# Patient Record
Sex: Male | Born: 1935
Health system: Southern US, Community
[De-identification: ages and names within clinical notes are randomized; demographics above are authoritative.]

## PROBLEM LIST (undated history)

## (undated) DIAGNOSIS — E7800 Pure hypercholesterolemia, unspecified: Secondary | ICD-10-CM

## (undated) DIAGNOSIS — H9190 Unspecified hearing loss, unspecified ear: Secondary | ICD-10-CM

## (undated) DIAGNOSIS — I10 Essential (primary) hypertension: Secondary | ICD-10-CM

## (undated) DIAGNOSIS — G629 Polyneuropathy, unspecified: Secondary | ICD-10-CM

## (undated) DIAGNOSIS — C61 Malignant neoplasm of prostate: Secondary | ICD-10-CM

## (undated) DIAGNOSIS — M1990 Unspecified osteoarthritis, unspecified site: Secondary | ICD-10-CM

## (undated) DIAGNOSIS — G4761 Periodic limb movement disorder: Secondary | ICD-10-CM

## (undated) HISTORY — DX: Essential (primary) hypertension: I10

## (undated) HISTORY — DX: Polyneuropathy, unspecified: G62.9

## (undated) HISTORY — DX: Periodic limb movement disorder: G47.61

## (undated) HISTORY — DX: Unspecified hearing loss, unspecified ear: H91.90

## (undated) HISTORY — DX: Pure hypercholesterolemia, unspecified: E78.00

## (undated) HISTORY — PX: PENILE PROSTHESIS IMPLANT: SHX240

## (undated) HISTORY — PX: APPENDECTOMY: SHX54

## (undated) HISTORY — DX: Unspecified osteoarthritis, unspecified site: M19.90

## (undated) HISTORY — PX: PROSTATECTOMY: SHX69

## (undated) HISTORY — DX: Malignant neoplasm of prostate: C61

## (undated) HISTORY — PX: TONSILLECTOMY AND ADENOIDECTOMY: SUR1326

---

## 2004-07-24 ENCOUNTER — Encounter: Admission: RE | Admit: 2004-07-24 | Discharge: 2004-07-24 | Payer: Self-pay | Admitting: General Surgery

## 2004-07-30 ENCOUNTER — Ambulatory Visit (HOSPITAL_COMMUNITY): Admission: RE | Admit: 2004-07-30 | Discharge: 2004-07-30 | Payer: Self-pay | Admitting: General Surgery

## 2004-07-30 ENCOUNTER — Ambulatory Visit (HOSPITAL_BASED_OUTPATIENT_CLINIC_OR_DEPARTMENT_OTHER): Admission: RE | Admit: 2004-07-30 | Discharge: 2004-07-30 | Payer: Self-pay | Admitting: General Surgery

## 2014-03-04 DIAGNOSIS — E780 Pure hypercholesterolemia: Secondary | ICD-10-CM | POA: Diagnosis not present

## 2014-03-04 DIAGNOSIS — R001 Bradycardia, unspecified: Secondary | ICD-10-CM | POA: Diagnosis not present

## 2014-03-04 DIAGNOSIS — Z7982 Long term (current) use of aspirin: Secondary | ICD-10-CM | POA: Diagnosis not present

## 2014-03-04 DIAGNOSIS — M1990 Unspecified osteoarthritis, unspecified site: Secondary | ICD-10-CM | POA: Diagnosis not present

## 2014-03-04 DIAGNOSIS — R55 Syncope and collapse: Secondary | ICD-10-CM | POA: Diagnosis not present

## 2014-03-04 DIAGNOSIS — I10 Essential (primary) hypertension: Secondary | ICD-10-CM | POA: Diagnosis not present

## 2014-03-04 DIAGNOSIS — Z87891 Personal history of nicotine dependence: Secondary | ICD-10-CM | POA: Diagnosis not present

## 2014-03-06 DIAGNOSIS — E780 Pure hypercholesterolemia: Secondary | ICD-10-CM | POA: Diagnosis not present

## 2014-03-06 DIAGNOSIS — G629 Polyneuropathy, unspecified: Secondary | ICD-10-CM | POA: Diagnosis not present

## 2014-03-06 DIAGNOSIS — I10 Essential (primary) hypertension: Secondary | ICD-10-CM | POA: Diagnosis not present

## 2014-03-06 DIAGNOSIS — R4181 Age-related cognitive decline: Secondary | ICD-10-CM | POA: Diagnosis not present

## 2014-03-06 DIAGNOSIS — G4709 Other insomnia: Secondary | ICD-10-CM | POA: Diagnosis not present

## 2014-06-26 DIAGNOSIS — M170 Bilateral primary osteoarthritis of knee: Secondary | ICD-10-CM | POA: Diagnosis not present

## 2014-08-15 ENCOUNTER — Telehealth: Payer: Self-pay | Admitting: Critical Care Medicine

## 2014-09-04 NOTE — Telephone Encounter (Signed)
error 

## 2014-09-18 DIAGNOSIS — H3531 Nonexudative age-related macular degeneration: Secondary | ICD-10-CM | POA: Diagnosis not present

## 2014-09-18 DIAGNOSIS — H5201 Hypermetropia, right eye: Secondary | ICD-10-CM | POA: Diagnosis not present

## 2014-09-28 DIAGNOSIS — M170 Bilateral primary osteoarthritis of knee: Secondary | ICD-10-CM | POA: Diagnosis not present

## 2014-10-19 DIAGNOSIS — L570 Actinic keratosis: Secondary | ICD-10-CM | POA: Diagnosis not present

## 2014-11-04 DIAGNOSIS — N3000 Acute cystitis without hematuria: Secondary | ICD-10-CM | POA: Diagnosis not present

## 2014-11-04 DIAGNOSIS — N451 Epididymitis: Secondary | ICD-10-CM | POA: Diagnosis not present

## 2014-11-20 DIAGNOSIS — Z23 Encounter for immunization: Secondary | ICD-10-CM | POA: Diagnosis not present

## 2014-12-29 DIAGNOSIS — M1712 Unilateral primary osteoarthritis, left knee: Secondary | ICD-10-CM | POA: Diagnosis not present

## 2014-12-29 DIAGNOSIS — M1711 Unilateral primary osteoarthritis, right knee: Secondary | ICD-10-CM | POA: Diagnosis not present

## 2015-01-09 DIAGNOSIS — Z7689 Persons encountering health services in other specified circumstances: Secondary | ICD-10-CM | POA: Diagnosis not present

## 2015-01-09 DIAGNOSIS — H6123 Impacted cerumen, bilateral: Secondary | ICD-10-CM | POA: Diagnosis not present

## 2015-01-09 DIAGNOSIS — H903 Sensorineural hearing loss, bilateral: Secondary | ICD-10-CM | POA: Diagnosis not present

## 2015-01-11 ENCOUNTER — Other Ambulatory Visit: Payer: Self-pay | Admitting: Otolaryngology

## 2015-01-11 DIAGNOSIS — H903 Sensorineural hearing loss, bilateral: Secondary | ICD-10-CM

## 2015-01-15 DIAGNOSIS — M1711 Unilateral primary osteoarthritis, right knee: Secondary | ICD-10-CM | POA: Diagnosis not present

## 2015-01-16 DIAGNOSIS — Z0000 Encounter for general adult medical examination without abnormal findings: Secondary | ICD-10-CM | POA: Diagnosis not present

## 2015-01-16 DIAGNOSIS — E7800 Pure hypercholesterolemia, unspecified: Secondary | ICD-10-CM | POA: Diagnosis not present

## 2015-01-16 DIAGNOSIS — Z79899 Other long term (current) drug therapy: Secondary | ICD-10-CM | POA: Diagnosis not present

## 2015-01-16 DIAGNOSIS — I10 Essential (primary) hypertension: Secondary | ICD-10-CM | POA: Diagnosis not present

## 2015-01-16 DIAGNOSIS — G4709 Other insomnia: Secondary | ICD-10-CM | POA: Diagnosis not present

## 2015-01-16 DIAGNOSIS — G629 Polyneuropathy, unspecified: Secondary | ICD-10-CM | POA: Diagnosis not present

## 2015-01-22 DIAGNOSIS — M1712 Unilateral primary osteoarthritis, left knee: Secondary | ICD-10-CM | POA: Diagnosis not present

## 2015-01-29 ENCOUNTER — Ambulatory Visit
Admission: RE | Admit: 2015-01-29 | Discharge: 2015-01-29 | Disposition: A | Discharge status: Home / Self Care | Payer: Medicare Other | Source: Ambulatory Visit | Attending: Otolaryngology | Admitting: Otolaryngology

## 2015-01-29 DIAGNOSIS — H903 Sensorineural hearing loss, bilateral: Secondary | ICD-10-CM

## 2015-01-29 DIAGNOSIS — H9193 Unspecified hearing loss, bilateral: Secondary | ICD-10-CM | POA: Diagnosis not present

## 2015-01-29 MED ORDER — GADOBENATE DIMEGLUMINE 529 MG/ML IV SOLN
19.0000 mL | Freq: Once | INTRAVENOUS | Status: AC | PRN
  Administered 2015-01-29: 19 mL via INTRAVENOUS

## 2015-03-05 DIAGNOSIS — H903 Sensorineural hearing loss, bilateral: Secondary | ICD-10-CM | POA: Diagnosis not present

## 2015-03-21 DIAGNOSIS — H353132 Nonexudative age-related macular degeneration, bilateral, intermediate dry stage: Secondary | ICD-10-CM | POA: Diagnosis not present

## 2015-04-09 DIAGNOSIS — H9190 Unspecified hearing loss, unspecified ear: Secondary | ICD-10-CM | POA: Diagnosis not present

## 2015-04-12 DIAGNOSIS — H353232 Exudative age-related macular degeneration, bilateral, with inactive choroidal neovascularization: Secondary | ICD-10-CM | POA: Diagnosis not present

## 2015-05-25 DIAGNOSIS — M1711 Unilateral primary osteoarthritis, right knee: Secondary | ICD-10-CM | POA: Diagnosis not present

## 2015-05-25 DIAGNOSIS — M1712 Unilateral primary osteoarthritis, left knee: Secondary | ICD-10-CM | POA: Diagnosis not present

## 2015-05-25 DIAGNOSIS — M170 Bilateral primary osteoarthritis of knee: Secondary | ICD-10-CM | POA: Diagnosis not present

## 2015-07-24 DIAGNOSIS — M170 Bilateral primary osteoarthritis of knee: Secondary | ICD-10-CM | POA: Diagnosis not present

## 2015-07-31 DIAGNOSIS — M1712 Unilateral primary osteoarthritis, left knee: Secondary | ICD-10-CM | POA: Diagnosis not present

## 2015-08-01 DIAGNOSIS — M1711 Unilateral primary osteoarthritis, right knee: Secondary | ICD-10-CM | POA: Diagnosis not present

## 2015-08-21 DIAGNOSIS — M25572 Pain in left ankle and joints of left foot: Secondary | ICD-10-CM | POA: Diagnosis not present

## 2015-08-30 DIAGNOSIS — M25672 Stiffness of left ankle, not elsewhere classified: Secondary | ICD-10-CM | POA: Diagnosis not present

## 2015-08-30 DIAGNOSIS — M25572 Pain in left ankle and joints of left foot: Secondary | ICD-10-CM | POA: Diagnosis not present

## 2015-08-30 DIAGNOSIS — R2689 Other abnormalities of gait and mobility: Secondary | ICD-10-CM | POA: Diagnosis not present

## 2015-09-03 DIAGNOSIS — M25672 Stiffness of left ankle, not elsewhere classified: Secondary | ICD-10-CM | POA: Diagnosis not present

## 2015-09-03 DIAGNOSIS — M25572 Pain in left ankle and joints of left foot: Secondary | ICD-10-CM | POA: Diagnosis not present

## 2015-09-03 DIAGNOSIS — R2689 Other abnormalities of gait and mobility: Secondary | ICD-10-CM | POA: Diagnosis not present

## 2015-09-06 DIAGNOSIS — R2689 Other abnormalities of gait and mobility: Secondary | ICD-10-CM | POA: Diagnosis not present

## 2015-09-06 DIAGNOSIS — M25672 Stiffness of left ankle, not elsewhere classified: Secondary | ICD-10-CM | POA: Diagnosis not present

## 2015-09-06 DIAGNOSIS — M25572 Pain in left ankle and joints of left foot: Secondary | ICD-10-CM | POA: Diagnosis not present

## 2015-09-10 DIAGNOSIS — R2689 Other abnormalities of gait and mobility: Secondary | ICD-10-CM | POA: Diagnosis not present

## 2015-09-10 DIAGNOSIS — M25672 Stiffness of left ankle, not elsewhere classified: Secondary | ICD-10-CM | POA: Diagnosis not present

## 2015-09-10 DIAGNOSIS — M25572 Pain in left ankle and joints of left foot: Secondary | ICD-10-CM | POA: Diagnosis not present

## 2015-09-12 DIAGNOSIS — M1712 Unilateral primary osteoarthritis, left knee: Secondary | ICD-10-CM | POA: Diagnosis not present

## 2015-09-12 DIAGNOSIS — M1711 Unilateral primary osteoarthritis, right knee: Secondary | ICD-10-CM | POA: Diagnosis not present

## 2015-09-13 DIAGNOSIS — R2689 Other abnormalities of gait and mobility: Secondary | ICD-10-CM | POA: Diagnosis not present

## 2015-09-13 DIAGNOSIS — M25572 Pain in left ankle and joints of left foot: Secondary | ICD-10-CM | POA: Diagnosis not present

## 2015-09-13 DIAGNOSIS — M25672 Stiffness of left ankle, not elsewhere classified: Secondary | ICD-10-CM | POA: Diagnosis not present

## 2015-09-17 DIAGNOSIS — Z23 Encounter for immunization: Secondary | ICD-10-CM | POA: Diagnosis not present

## 2015-09-17 DIAGNOSIS — S41101S Unspecified open wound of right upper arm, sequela: Secondary | ICD-10-CM | POA: Diagnosis not present

## 2015-09-17 DIAGNOSIS — M7989 Other specified soft tissue disorders: Secondary | ICD-10-CM | POA: Diagnosis not present

## 2015-09-19 DIAGNOSIS — M25572 Pain in left ankle and joints of left foot: Secondary | ICD-10-CM | POA: Diagnosis not present

## 2015-09-19 DIAGNOSIS — R2689 Other abnormalities of gait and mobility: Secondary | ICD-10-CM | POA: Diagnosis not present

## 2015-09-19 DIAGNOSIS — M25672 Stiffness of left ankle, not elsewhere classified: Secondary | ICD-10-CM | POA: Diagnosis not present

## 2015-09-21 DIAGNOSIS — M25672 Stiffness of left ankle, not elsewhere classified: Secondary | ICD-10-CM | POA: Diagnosis not present

## 2015-09-21 DIAGNOSIS — M25572 Pain in left ankle and joints of left foot: Secondary | ICD-10-CM | POA: Diagnosis not present

## 2015-09-21 DIAGNOSIS — R2689 Other abnormalities of gait and mobility: Secondary | ICD-10-CM | POA: Diagnosis not present

## 2015-09-25 DIAGNOSIS — M1711 Unilateral primary osteoarthritis, right knee: Secondary | ICD-10-CM | POA: Diagnosis not present

## 2015-09-25 DIAGNOSIS — M25672 Stiffness of left ankle, not elsewhere classified: Secondary | ICD-10-CM | POA: Diagnosis not present

## 2015-09-25 DIAGNOSIS — R2689 Other abnormalities of gait and mobility: Secondary | ICD-10-CM | POA: Diagnosis not present

## 2015-09-25 DIAGNOSIS — M1712 Unilateral primary osteoarthritis, left knee: Secondary | ICD-10-CM | POA: Diagnosis not present

## 2015-09-25 DIAGNOSIS — M25572 Pain in left ankle and joints of left foot: Secondary | ICD-10-CM | POA: Diagnosis not present

## 2015-09-27 DIAGNOSIS — M25572 Pain in left ankle and joints of left foot: Secondary | ICD-10-CM | POA: Diagnosis not present

## 2015-09-27 DIAGNOSIS — M25672 Stiffness of left ankle, not elsewhere classified: Secondary | ICD-10-CM | POA: Diagnosis not present

## 2015-09-27 DIAGNOSIS — R2689 Other abnormalities of gait and mobility: Secondary | ICD-10-CM | POA: Diagnosis not present

## 2015-10-04 DIAGNOSIS — M25572 Pain in left ankle and joints of left foot: Secondary | ICD-10-CM | POA: Diagnosis not present

## 2015-10-04 DIAGNOSIS — R2689 Other abnormalities of gait and mobility: Secondary | ICD-10-CM | POA: Diagnosis not present

## 2015-10-04 DIAGNOSIS — M25672 Stiffness of left ankle, not elsewhere classified: Secondary | ICD-10-CM | POA: Diagnosis not present

## 2015-10-09 DIAGNOSIS — R2689 Other abnormalities of gait and mobility: Secondary | ICD-10-CM | POA: Diagnosis not present

## 2015-10-09 DIAGNOSIS — M25572 Pain in left ankle and joints of left foot: Secondary | ICD-10-CM | POA: Diagnosis not present

## 2015-10-09 DIAGNOSIS — M25672 Stiffness of left ankle, not elsewhere classified: Secondary | ICD-10-CM | POA: Diagnosis not present

## 2015-11-14 DIAGNOSIS — Z23 Encounter for immunization: Secondary | ICD-10-CM | POA: Diagnosis not present

## 2015-12-24 DIAGNOSIS — M170 Bilateral primary osteoarthritis of knee: Secondary | ICD-10-CM | POA: Diagnosis not present

## 2016-02-04 DIAGNOSIS — M1711 Unilateral primary osteoarthritis, right knee: Secondary | ICD-10-CM | POA: Diagnosis not present

## 2016-02-08 DIAGNOSIS — M1712 Unilateral primary osteoarthritis, left knee: Secondary | ICD-10-CM | POA: Diagnosis not present

## 2016-03-11 DIAGNOSIS — Z0000 Encounter for general adult medical examination without abnormal findings: Secondary | ICD-10-CM | POA: Diagnosis not present

## 2016-03-11 DIAGNOSIS — E785 Hyperlipidemia, unspecified: Secondary | ICD-10-CM | POA: Diagnosis not present

## 2016-03-11 DIAGNOSIS — I10 Essential (primary) hypertension: Secondary | ICD-10-CM | POA: Diagnosis not present

## 2016-03-11 DIAGNOSIS — Z79899 Other long term (current) drug therapy: Secondary | ICD-10-CM | POA: Diagnosis not present

## 2016-03-25 DIAGNOSIS — M25569 Pain in unspecified knee: Secondary | ICD-10-CM | POA: Diagnosis not present

## 2016-03-25 DIAGNOSIS — M1711 Unilateral primary osteoarthritis, right knee: Secondary | ICD-10-CM | POA: Diagnosis not present

## 2016-03-25 DIAGNOSIS — M1712 Unilateral primary osteoarthritis, left knee: Secondary | ICD-10-CM | POA: Diagnosis not present

## 2016-05-20 DIAGNOSIS — H353132 Nonexudative age-related macular degeneration, bilateral, intermediate dry stage: Secondary | ICD-10-CM | POA: Diagnosis not present

## 2016-05-20 DIAGNOSIS — H52223 Regular astigmatism, bilateral: Secondary | ICD-10-CM | POA: Diagnosis not present

## 2016-05-22 DIAGNOSIS — M1712 Unilateral primary osteoarthritis, left knee: Secondary | ICD-10-CM | POA: Diagnosis not present

## 2016-05-22 DIAGNOSIS — M1711 Unilateral primary osteoarthritis, right knee: Secondary | ICD-10-CM | POA: Diagnosis not present

## 2016-05-27 DIAGNOSIS — I10 Essential (primary) hypertension: Secondary | ICD-10-CM | POA: Diagnosis not present

## 2016-05-27 DIAGNOSIS — B356 Tinea cruris: Secondary | ICD-10-CM | POA: Diagnosis not present

## 2016-05-27 DIAGNOSIS — N401 Enlarged prostate with lower urinary tract symptoms: Secondary | ICD-10-CM | POA: Diagnosis not present

## 2016-05-27 DIAGNOSIS — G4761 Periodic limb movement disorder: Secondary | ICD-10-CM | POA: Diagnosis not present

## 2016-07-14 DIAGNOSIS — G2581 Restless legs syndrome: Secondary | ICD-10-CM | POA: Diagnosis not present

## 2016-07-14 DIAGNOSIS — M7989 Other specified soft tissue disorders: Secondary | ICD-10-CM | POA: Diagnosis not present

## 2016-07-29 DIAGNOSIS — M170 Bilateral primary osteoarthritis of knee: Secondary | ICD-10-CM | POA: Diagnosis not present

## 2016-07-29 DIAGNOSIS — M1711 Unilateral primary osteoarthritis, right knee: Secondary | ICD-10-CM | POA: Diagnosis not present

## 2016-07-29 DIAGNOSIS — M1712 Unilateral primary osteoarthritis, left knee: Secondary | ICD-10-CM | POA: Diagnosis not present

## 2016-08-03 IMAGING — MR MR HEAD WO/W CM
10 of 11 series · 30 of 48 positions shown · IV contrast (multihance)
Comparison: Head CT 03/04/2014

CLINICAL DATA: Bilateral hearing loss, assess for retrocochlear
lesion.

Creatinine was obtained on site at [HOSPITAL] at [HOSPITAL].
Results: Creatinine 1.0 mg/dL.
EXAM:
MRI HEAD WITHOUT AND WITH CONTRAST
TECHNIQUE: Multiplanar, multiecho pulse sequences of the brain and surrounding
structures were obtained without and with intravenous contrast.
CONTRAST:  19mL MULTIHANCE GADOBENATE DIMEGLUMINE 529 MG/ML IV SOLN

[Series 2: t1_se_sag · sagittal · 5.0mm · 0.45mm/px · 2 of 19 slices shown]
[im 1/19]
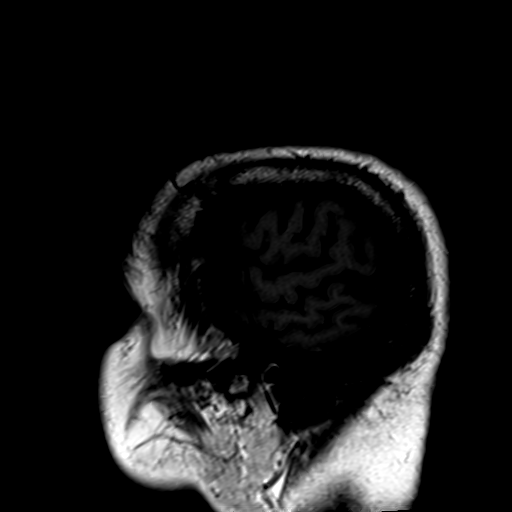
[im 19/19]
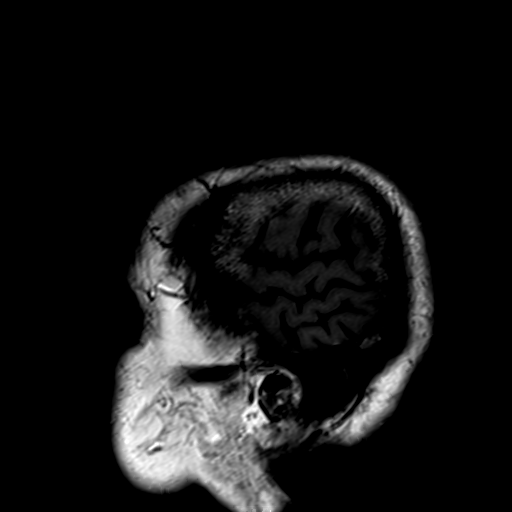

[Series 3: t2_tse_tra_512 · axial · 5.0mm · 0.30mm/px · z∈[-66,+78]mm · 2 of 22 slices shown]
[im 1/22]
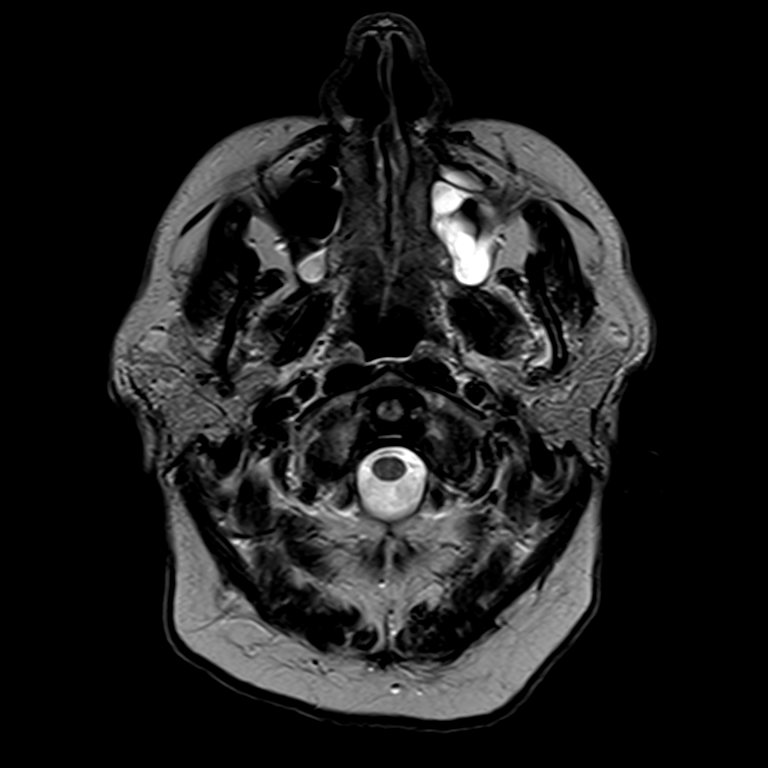
[im 22/22]
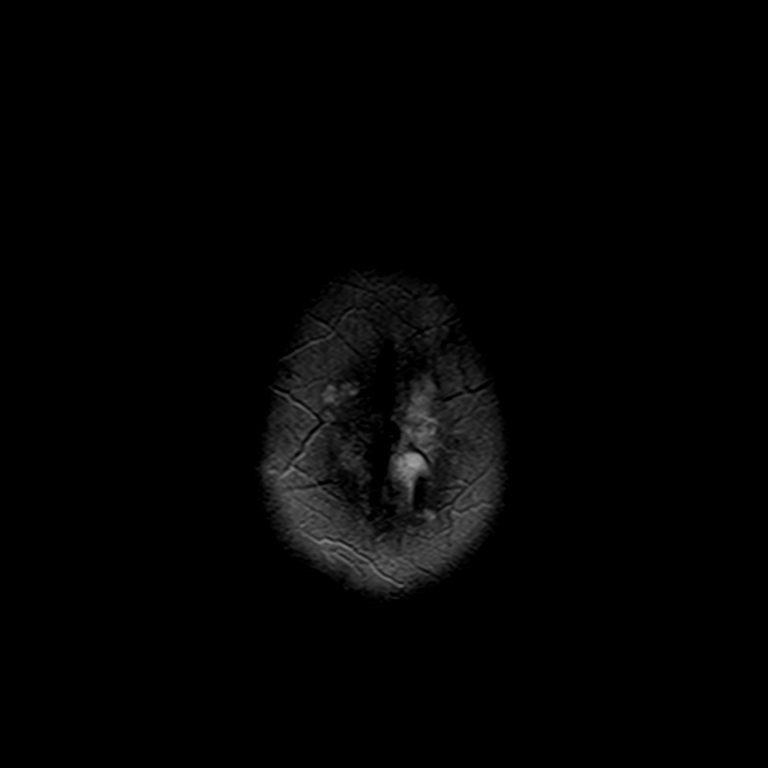

[Series 4: ep2d_diff_(id)_trace · axial · 3.0mm · 1.80mm/px · z∈[-64,+75]mm · 8 of 87 slices shown]
[im 1/87]
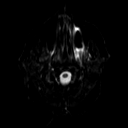
[im 18/87]
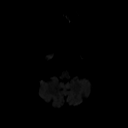
[im 26/87]
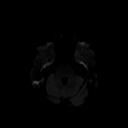
[im 35/87]
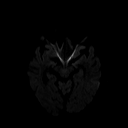
[im 52/87]
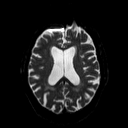
[im 61/87]
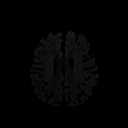
[im 69/87]
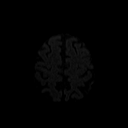
[im 87/87]
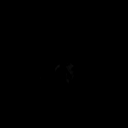

[Series 5: ep2d_diff_(id)_trace_adc · axial · 3.0mm · 1.80mm/px · z∈[-64,+75]mm · 6 of 44 slices shown]
[im 1/44]
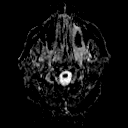
[im 9/44]
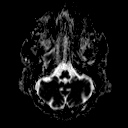
[im 18/44]
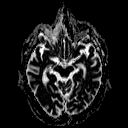
[im 26/44]
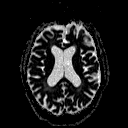
[im 35/44]
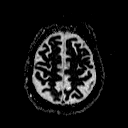
[im 44/44]
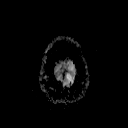

[Series 6: T1 · coronal · 3.0mm · 0.35mm/px · 2 of 12 slices shown (1 of 2)]
[im 1/12]
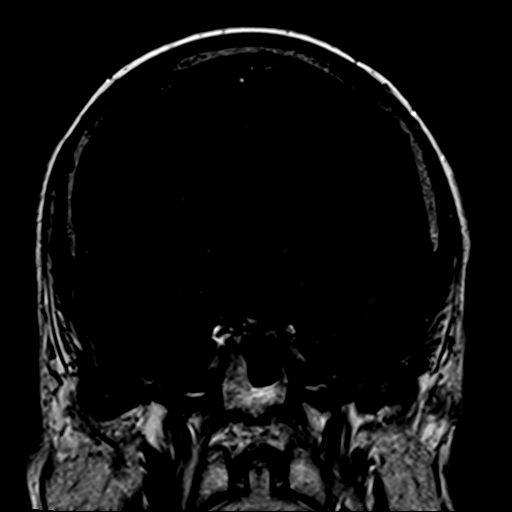
[im 12/12]
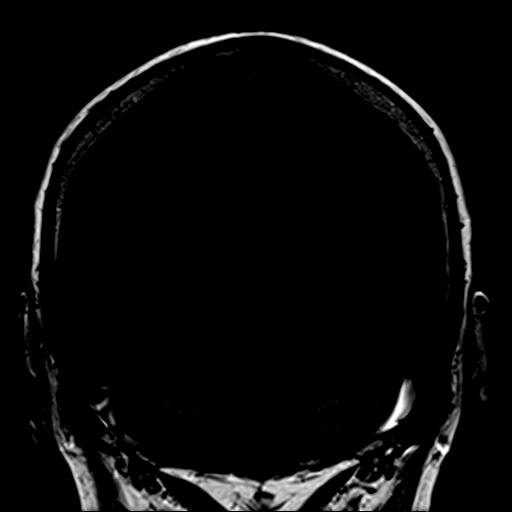

[Series 7: FLAIR · axial · 5.0mm · 0.45mm/px · z∈[-66,+78]mm · 3 of 22 slices shown]
[im 1/22]
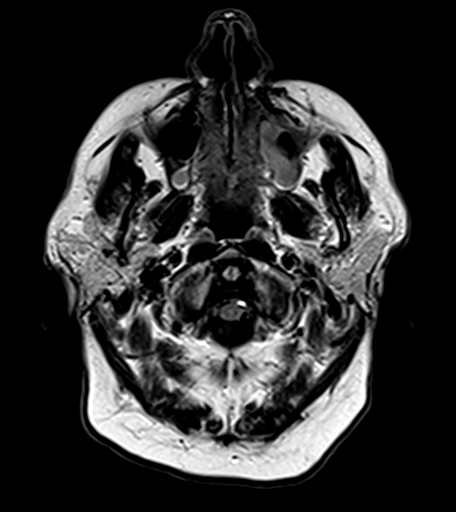
[im 11/22]
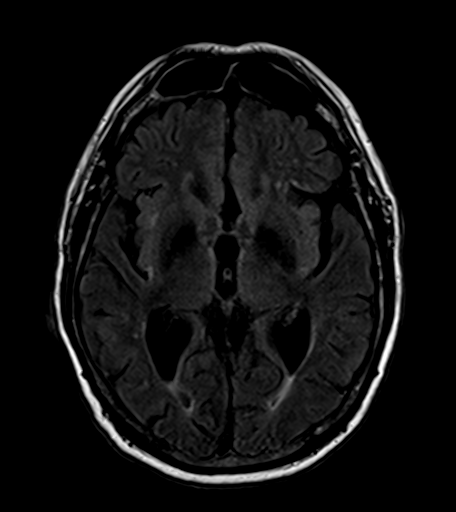
[im 22/22]
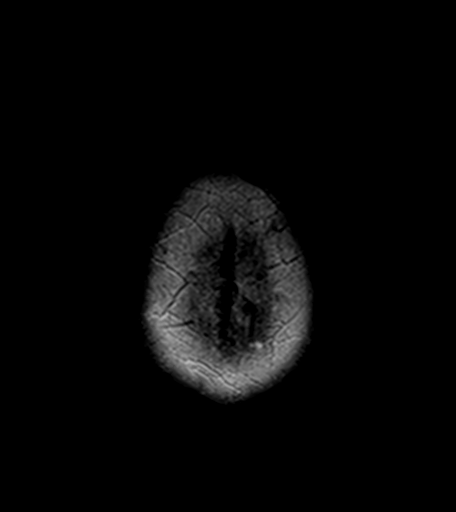

[Series 8: T1 · axial · 3.0mm · 0.35mm/px · z∈[-72,-39]mm · 2 of 12 slices shown (2 of 2)]
[im 1/12]
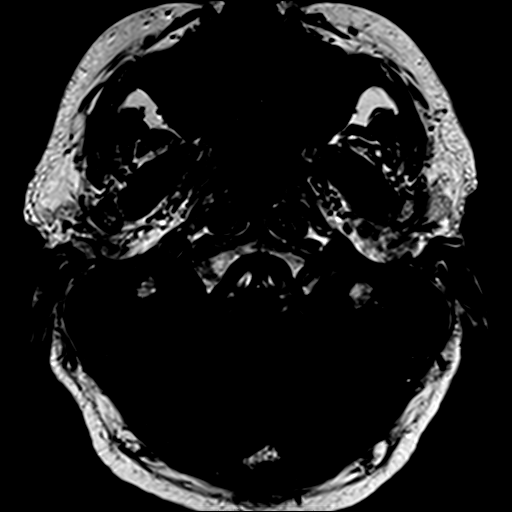
[im 12/12]
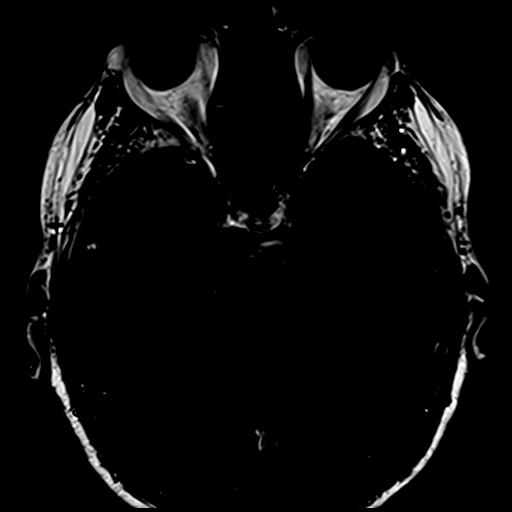

[Series 9: bSSFP · axial · 1.0mm · 0.35mm/px · 1 of 36 slices shown]
[im 1/36]
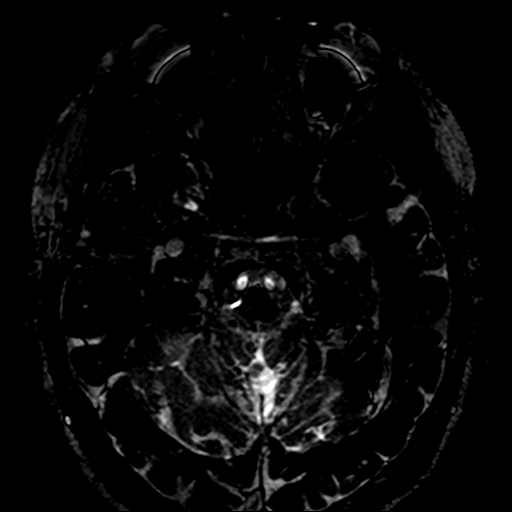

[Series 11: T1 post-contrast · coronal · 3.0mm · 0.35mm/px · 2 of 12 slices shown (1 of 2)]
[im 1/12]
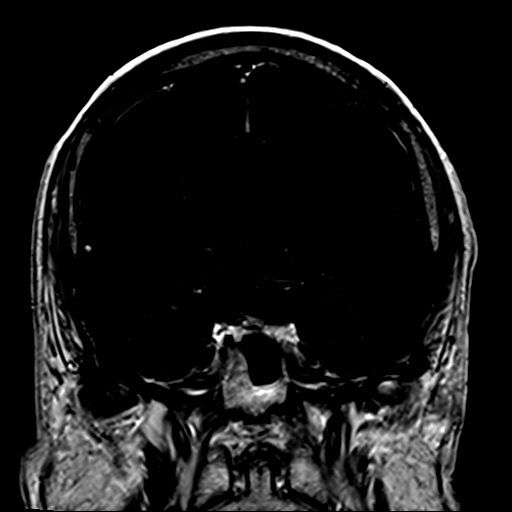
[im 12/12]
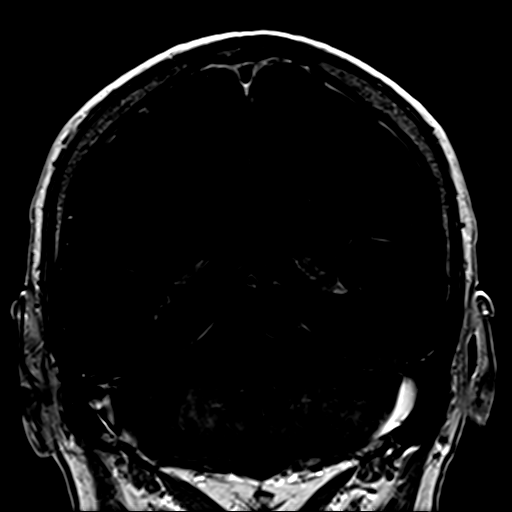

[Series 12: T1 post-contrast · axial · 3.0mm · 0.35mm/px · z∈[-72,-39]mm · 2 of 12 slices shown (2 of 2)]
[im 1/12]
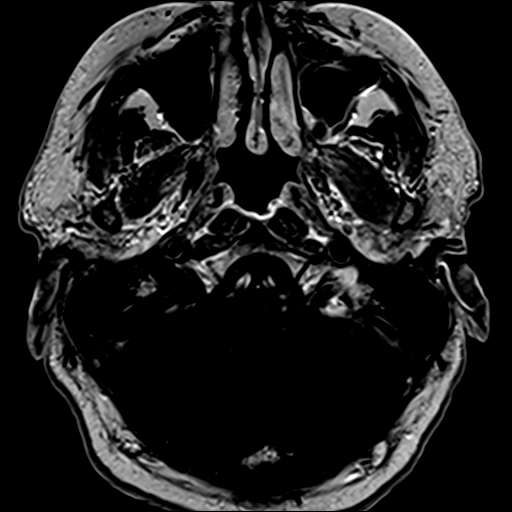
[im 12/12]
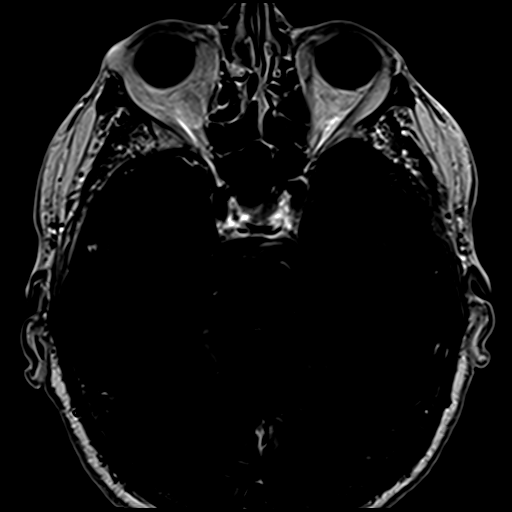

[30 of 48 positions shown; findings below may reference images not displayed]

FINDINGS: There are mild chronic small-vessel ischemic changes affecting the
pons. There are a few old small vessel cerebellar infarctions. The
cerebral hemispheres show mild generalized age related volume loss
with mild chronic small-vessel disease of the deep and subcortical
white matter. No cortical or large vessel territory infarction. No
mass lesion, hemorrhage, hydrocephalus or extra-axial collection. No
vestibular schwannoma or other CP angle lesion. No fluid in the
middle ears or mastoids. Mild mucosal inflammation is present in the
paranasal sinuses. Major vessels the base of the brain show flow.
IMPRESSION: No acute or reversible finding. No specific cause of hearing loss is
identified. Mild age related volume loss and chronic small vessel
ischemic changes throughout the brain as outlined above. No
vestibular schwannoma.

## 2016-08-07 DIAGNOSIS — M1711 Unilateral primary osteoarthritis, right knee: Secondary | ICD-10-CM | POA: Diagnosis not present

## 2016-08-12 DIAGNOSIS — M1712 Unilateral primary osteoarthritis, left knee: Secondary | ICD-10-CM | POA: Diagnosis not present

## 2016-08-25 DIAGNOSIS — H6122 Impacted cerumen, left ear: Secondary | ICD-10-CM | POA: Diagnosis not present

## 2016-09-02 DIAGNOSIS — M1711 Unilateral primary osteoarthritis, right knee: Secondary | ICD-10-CM | POA: Diagnosis not present

## 2016-09-02 DIAGNOSIS — M1712 Unilateral primary osteoarthritis, left knee: Secondary | ICD-10-CM | POA: Diagnosis not present

## 2016-09-16 DIAGNOSIS — N358 Other urethral stricture: Secondary | ICD-10-CM | POA: Diagnosis not present

## 2016-09-16 DIAGNOSIS — N3000 Acute cystitis without hematuria: Secondary | ICD-10-CM | POA: Diagnosis not present

## 2016-09-16 DIAGNOSIS — C61 Malignant neoplasm of prostate: Secondary | ICD-10-CM | POA: Diagnosis not present

## 2016-10-02 DIAGNOSIS — M1711 Unilateral primary osteoarthritis, right knee: Secondary | ICD-10-CM | POA: Diagnosis not present

## 2016-10-02 DIAGNOSIS — M1712 Unilateral primary osteoarthritis, left knee: Secondary | ICD-10-CM | POA: Diagnosis not present

## 2016-10-06 DIAGNOSIS — E7800 Pure hypercholesterolemia, unspecified: Secondary | ICD-10-CM | POA: Diagnosis not present

## 2016-10-06 DIAGNOSIS — G4700 Insomnia, unspecified: Secondary | ICD-10-CM | POA: Diagnosis not present

## 2016-10-06 DIAGNOSIS — I10 Essential (primary) hypertension: Secondary | ICD-10-CM | POA: Diagnosis not present

## 2016-10-06 DIAGNOSIS — M1711 Unilateral primary osteoarthritis, right knee: Secondary | ICD-10-CM | POA: Diagnosis not present

## 2016-10-06 DIAGNOSIS — Z87891 Personal history of nicotine dependence: Secondary | ICD-10-CM | POA: Diagnosis not present

## 2016-10-06 DIAGNOSIS — M1712 Unilateral primary osteoarthritis, left knee: Secondary | ICD-10-CM | POA: Diagnosis not present

## 2016-10-06 DIAGNOSIS — M25572 Pain in left ankle and joints of left foot: Secondary | ICD-10-CM | POA: Diagnosis not present

## 2016-10-06 DIAGNOSIS — G629 Polyneuropathy, unspecified: Secondary | ICD-10-CM | POA: Diagnosis not present

## 2016-10-06 DIAGNOSIS — K219 Gastro-esophageal reflux disease without esophagitis: Secondary | ICD-10-CM | POA: Diagnosis not present

## 2016-10-06 DIAGNOSIS — H9190 Unspecified hearing loss, unspecified ear: Secondary | ICD-10-CM | POA: Diagnosis not present

## 2016-10-06 DIAGNOSIS — M25569 Pain in unspecified knee: Secondary | ICD-10-CM | POA: Diagnosis not present

## 2016-10-10 DIAGNOSIS — G4700 Insomnia, unspecified: Secondary | ICD-10-CM | POA: Diagnosis not present

## 2016-10-10 DIAGNOSIS — M25572 Pain in left ankle and joints of left foot: Secondary | ICD-10-CM | POA: Diagnosis not present

## 2016-10-10 DIAGNOSIS — M1712 Unilateral primary osteoarthritis, left knee: Secondary | ICD-10-CM | POA: Diagnosis not present

## 2016-10-10 DIAGNOSIS — I10 Essential (primary) hypertension: Secondary | ICD-10-CM | POA: Diagnosis not present

## 2016-10-10 DIAGNOSIS — K219 Gastro-esophageal reflux disease without esophagitis: Secondary | ICD-10-CM | POA: Diagnosis not present

## 2016-10-10 DIAGNOSIS — H9190 Unspecified hearing loss, unspecified ear: Secondary | ICD-10-CM | POA: Diagnosis not present

## 2016-10-10 DIAGNOSIS — G629 Polyneuropathy, unspecified: Secondary | ICD-10-CM | POA: Diagnosis not present

## 2016-10-10 DIAGNOSIS — M25569 Pain in unspecified knee: Secondary | ICD-10-CM | POA: Diagnosis not present

## 2016-10-10 DIAGNOSIS — E7800 Pure hypercholesterolemia, unspecified: Secondary | ICD-10-CM | POA: Diagnosis not present

## 2016-10-10 DIAGNOSIS — M1711 Unilateral primary osteoarthritis, right knee: Secondary | ICD-10-CM | POA: Diagnosis not present

## 2016-10-10 DIAGNOSIS — Z87891 Personal history of nicotine dependence: Secondary | ICD-10-CM | POA: Diagnosis not present

## 2016-10-13 DIAGNOSIS — E7800 Pure hypercholesterolemia, unspecified: Secondary | ICD-10-CM | POA: Diagnosis not present

## 2016-10-13 DIAGNOSIS — M25569 Pain in unspecified knee: Secondary | ICD-10-CM | POA: Diagnosis not present

## 2016-10-13 DIAGNOSIS — G4700 Insomnia, unspecified: Secondary | ICD-10-CM | POA: Diagnosis not present

## 2016-10-13 DIAGNOSIS — G629 Polyneuropathy, unspecified: Secondary | ICD-10-CM | POA: Diagnosis not present

## 2016-10-13 DIAGNOSIS — M1711 Unilateral primary osteoarthritis, right knee: Secondary | ICD-10-CM | POA: Diagnosis not present

## 2016-10-13 DIAGNOSIS — H9190 Unspecified hearing loss, unspecified ear: Secondary | ICD-10-CM | POA: Diagnosis not present

## 2016-10-13 DIAGNOSIS — I10 Essential (primary) hypertension: Secondary | ICD-10-CM | POA: Diagnosis not present

## 2016-10-13 DIAGNOSIS — M1712 Unilateral primary osteoarthritis, left knee: Secondary | ICD-10-CM | POA: Diagnosis not present

## 2016-10-13 DIAGNOSIS — Z87891 Personal history of nicotine dependence: Secondary | ICD-10-CM | POA: Diagnosis not present

## 2016-10-13 DIAGNOSIS — K219 Gastro-esophageal reflux disease without esophagitis: Secondary | ICD-10-CM | POA: Diagnosis not present

## 2016-10-13 DIAGNOSIS — M25572 Pain in left ankle and joints of left foot: Secondary | ICD-10-CM | POA: Diagnosis not present

## 2016-10-14 DIAGNOSIS — N358 Other urethral stricture: Secondary | ICD-10-CM | POA: Diagnosis not present

## 2016-10-14 DIAGNOSIS — C61 Malignant neoplasm of prostate: Secondary | ICD-10-CM | POA: Diagnosis not present

## 2016-10-14 DIAGNOSIS — N3000 Acute cystitis without hematuria: Secondary | ICD-10-CM | POA: Diagnosis not present

## 2016-10-15 DIAGNOSIS — M25572 Pain in left ankle and joints of left foot: Secondary | ICD-10-CM | POA: Diagnosis not present

## 2016-10-15 DIAGNOSIS — M1712 Unilateral primary osteoarthritis, left knee: Secondary | ICD-10-CM | POA: Diagnosis not present

## 2016-10-15 DIAGNOSIS — H9190 Unspecified hearing loss, unspecified ear: Secondary | ICD-10-CM | POA: Diagnosis not present

## 2016-10-15 DIAGNOSIS — G4700 Insomnia, unspecified: Secondary | ICD-10-CM | POA: Diagnosis not present

## 2016-10-15 DIAGNOSIS — M1711 Unilateral primary osteoarthritis, right knee: Secondary | ICD-10-CM | POA: Diagnosis not present

## 2016-10-15 DIAGNOSIS — I10 Essential (primary) hypertension: Secondary | ICD-10-CM | POA: Diagnosis not present

## 2016-10-15 DIAGNOSIS — M25569 Pain in unspecified knee: Secondary | ICD-10-CM | POA: Diagnosis not present

## 2016-10-15 DIAGNOSIS — G629 Polyneuropathy, unspecified: Secondary | ICD-10-CM | POA: Diagnosis not present

## 2016-10-15 DIAGNOSIS — E7800 Pure hypercholesterolemia, unspecified: Secondary | ICD-10-CM | POA: Diagnosis not present

## 2016-10-15 DIAGNOSIS — K219 Gastro-esophageal reflux disease without esophagitis: Secondary | ICD-10-CM | POA: Diagnosis not present

## 2016-10-15 DIAGNOSIS — Z87891 Personal history of nicotine dependence: Secondary | ICD-10-CM | POA: Diagnosis not present

## 2016-10-16 DIAGNOSIS — G4709 Other insomnia: Secondary | ICD-10-CM | POA: Diagnosis not present

## 2016-10-16 DIAGNOSIS — I10 Essential (primary) hypertension: Secondary | ICD-10-CM | POA: Diagnosis not present

## 2016-10-16 DIAGNOSIS — G4761 Periodic limb movement disorder: Secondary | ICD-10-CM | POA: Diagnosis not present

## 2016-10-20 DIAGNOSIS — I10 Essential (primary) hypertension: Secondary | ICD-10-CM | POA: Diagnosis not present

## 2016-10-20 DIAGNOSIS — G4700 Insomnia, unspecified: Secondary | ICD-10-CM | POA: Diagnosis not present

## 2016-10-20 DIAGNOSIS — M25572 Pain in left ankle and joints of left foot: Secondary | ICD-10-CM | POA: Diagnosis not present

## 2016-10-20 DIAGNOSIS — M1711 Unilateral primary osteoarthritis, right knee: Secondary | ICD-10-CM | POA: Diagnosis not present

## 2016-10-20 DIAGNOSIS — M25569 Pain in unspecified knee: Secondary | ICD-10-CM | POA: Diagnosis not present

## 2016-10-20 DIAGNOSIS — H9190 Unspecified hearing loss, unspecified ear: Secondary | ICD-10-CM | POA: Diagnosis not present

## 2016-10-20 DIAGNOSIS — K219 Gastro-esophageal reflux disease without esophagitis: Secondary | ICD-10-CM | POA: Diagnosis not present

## 2016-10-20 DIAGNOSIS — M1712 Unilateral primary osteoarthritis, left knee: Secondary | ICD-10-CM | POA: Diagnosis not present

## 2016-10-20 DIAGNOSIS — Z87891 Personal history of nicotine dependence: Secondary | ICD-10-CM | POA: Diagnosis not present

## 2016-10-20 DIAGNOSIS — G629 Polyneuropathy, unspecified: Secondary | ICD-10-CM | POA: Diagnosis not present

## 2016-10-20 DIAGNOSIS — E7800 Pure hypercholesterolemia, unspecified: Secondary | ICD-10-CM | POA: Diagnosis not present

## 2016-10-22 DIAGNOSIS — M1712 Unilateral primary osteoarthritis, left knee: Secondary | ICD-10-CM | POA: Diagnosis not present

## 2016-10-22 DIAGNOSIS — E7800 Pure hypercholesterolemia, unspecified: Secondary | ICD-10-CM | POA: Diagnosis not present

## 2016-10-22 DIAGNOSIS — H9190 Unspecified hearing loss, unspecified ear: Secondary | ICD-10-CM | POA: Diagnosis not present

## 2016-10-22 DIAGNOSIS — M25572 Pain in left ankle and joints of left foot: Secondary | ICD-10-CM | POA: Diagnosis not present

## 2016-10-22 DIAGNOSIS — G629 Polyneuropathy, unspecified: Secondary | ICD-10-CM | POA: Diagnosis not present

## 2016-10-22 DIAGNOSIS — M1711 Unilateral primary osteoarthritis, right knee: Secondary | ICD-10-CM | POA: Diagnosis not present

## 2016-10-22 DIAGNOSIS — Z87891 Personal history of nicotine dependence: Secondary | ICD-10-CM | POA: Diagnosis not present

## 2016-10-22 DIAGNOSIS — I10 Essential (primary) hypertension: Secondary | ICD-10-CM | POA: Diagnosis not present

## 2016-10-22 DIAGNOSIS — K219 Gastro-esophageal reflux disease without esophagitis: Secondary | ICD-10-CM | POA: Diagnosis not present

## 2016-10-22 DIAGNOSIS — G4700 Insomnia, unspecified: Secondary | ICD-10-CM | POA: Diagnosis not present

## 2016-10-22 DIAGNOSIS — M25569 Pain in unspecified knee: Secondary | ICD-10-CM | POA: Diagnosis not present

## 2016-11-04 DIAGNOSIS — N3020 Other chronic cystitis without hematuria: Secondary | ICD-10-CM | POA: Diagnosis not present

## 2016-11-04 DIAGNOSIS — C61 Malignant neoplasm of prostate: Secondary | ICD-10-CM | POA: Diagnosis not present

## 2016-11-04 DIAGNOSIS — N358 Other urethral stricture: Secondary | ICD-10-CM | POA: Diagnosis not present

## 2016-11-11 DIAGNOSIS — M170 Bilateral primary osteoarthritis of knee: Secondary | ICD-10-CM | POA: Diagnosis not present

## 2016-11-20 DIAGNOSIS — Z23 Encounter for immunization: Secondary | ICD-10-CM | POA: Diagnosis not present

## 2016-11-29 DIAGNOSIS — M7989 Other specified soft tissue disorders: Secondary | ICD-10-CM | POA: Diagnosis not present

## 2016-11-29 DIAGNOSIS — M79662 Pain in left lower leg: Secondary | ICD-10-CM | POA: Diagnosis not present

## 2016-11-29 DIAGNOSIS — M25572 Pain in left ankle and joints of left foot: Secondary | ICD-10-CM | POA: Diagnosis not present

## 2016-11-29 DIAGNOSIS — M79605 Pain in left leg: Secondary | ICD-10-CM | POA: Diagnosis not present

## 2016-11-29 DIAGNOSIS — Z87891 Personal history of nicotine dependence: Secondary | ICD-10-CM | POA: Diagnosis not present

## 2016-11-29 DIAGNOSIS — R609 Edema, unspecified: Secondary | ICD-10-CM | POA: Diagnosis not present

## 2016-11-29 DIAGNOSIS — I10 Essential (primary) hypertension: Secondary | ICD-10-CM | POA: Diagnosis not present

## 2016-11-29 DIAGNOSIS — M79672 Pain in left foot: Secondary | ICD-10-CM | POA: Diagnosis not present

## 2016-12-04 DIAGNOSIS — L570 Actinic keratosis: Secondary | ICD-10-CM | POA: Diagnosis not present

## 2016-12-04 DIAGNOSIS — M1712 Unilateral primary osteoarthritis, left knee: Secondary | ICD-10-CM | POA: Diagnosis not present

## 2016-12-04 DIAGNOSIS — L821 Other seborrheic keratosis: Secondary | ICD-10-CM | POA: Diagnosis not present

## 2016-12-04 DIAGNOSIS — L7211 Pilar cyst: Secondary | ICD-10-CM | POA: Diagnosis not present

## 2017-01-26 DIAGNOSIS — R0600 Dyspnea, unspecified: Secondary | ICD-10-CM | POA: Diagnosis not present

## 2017-01-26 DIAGNOSIS — J40 Bronchitis, not specified as acute or chronic: Secondary | ICD-10-CM | POA: Diagnosis not present

## 2017-01-27 DIAGNOSIS — E785 Hyperlipidemia, unspecified: Secondary | ICD-10-CM | POA: Diagnosis not present

## 2017-01-27 DIAGNOSIS — J40 Bronchitis, not specified as acute or chronic: Secondary | ICD-10-CM | POA: Diagnosis not present

## 2017-01-27 DIAGNOSIS — Z7982 Long term (current) use of aspirin: Secondary | ICD-10-CM | POA: Diagnosis not present

## 2017-01-27 DIAGNOSIS — R05 Cough: Secondary | ICD-10-CM | POA: Diagnosis not present

## 2017-01-27 DIAGNOSIS — J189 Pneumonia, unspecified organism: Secondary | ICD-10-CM | POA: Diagnosis not present

## 2017-01-27 DIAGNOSIS — R778 Other specified abnormalities of plasma proteins: Secondary | ICD-10-CM | POA: Diagnosis not present

## 2017-01-27 DIAGNOSIS — I2510 Atherosclerotic heart disease of native coronary artery without angina pectoris: Secondary | ICD-10-CM | POA: Diagnosis not present

## 2017-01-27 DIAGNOSIS — Z79899 Other long term (current) drug therapy: Secondary | ICD-10-CM | POA: Diagnosis not present

## 2017-01-27 DIAGNOSIS — R918 Other nonspecific abnormal finding of lung field: Secondary | ICD-10-CM | POA: Diagnosis not present

## 2017-01-27 DIAGNOSIS — G9340 Encephalopathy, unspecified: Secondary | ICD-10-CM | POA: Diagnosis not present

## 2017-01-27 DIAGNOSIS — R0602 Shortness of breath: Secondary | ICD-10-CM | POA: Diagnosis not present

## 2017-01-27 DIAGNOSIS — I10 Essential (primary) hypertension: Secondary | ICD-10-CM | POA: Diagnosis not present

## 2017-01-27 DIAGNOSIS — J209 Acute bronchitis, unspecified: Secondary | ICD-10-CM | POA: Diagnosis not present

## 2017-01-27 DIAGNOSIS — R748 Abnormal levels of other serum enzymes: Secondary | ICD-10-CM | POA: Diagnosis not present

## 2017-01-27 DIAGNOSIS — K219 Gastro-esophageal reflux disease without esophagitis: Secondary | ICD-10-CM | POA: Diagnosis not present

## 2017-01-28 DIAGNOSIS — J40 Bronchitis, not specified as acute or chronic: Secondary | ICD-10-CM | POA: Diagnosis not present

## 2017-01-28 DIAGNOSIS — R0602 Shortness of breath: Secondary | ICD-10-CM | POA: Diagnosis not present

## 2017-01-28 DIAGNOSIS — R748 Abnormal levels of other serum enzymes: Secondary | ICD-10-CM | POA: Diagnosis not present

## 2017-01-31 DIAGNOSIS — I10 Essential (primary) hypertension: Secondary | ICD-10-CM | POA: Diagnosis not present

## 2017-01-31 DIAGNOSIS — Z7952 Long term (current) use of systemic steroids: Secondary | ICD-10-CM | POA: Diagnosis not present

## 2017-01-31 DIAGNOSIS — J439 Emphysema, unspecified: Secondary | ICD-10-CM | POA: Diagnosis not present

## 2017-01-31 DIAGNOSIS — M170 Bilateral primary osteoarthritis of knee: Secondary | ICD-10-CM | POA: Diagnosis not present

## 2017-01-31 DIAGNOSIS — I700 Atherosclerosis of aorta: Secondary | ICD-10-CM | POA: Diagnosis not present

## 2017-01-31 DIAGNOSIS — Z7982 Long term (current) use of aspirin: Secondary | ICD-10-CM | POA: Diagnosis not present

## 2017-01-31 DIAGNOSIS — G4700 Insomnia, unspecified: Secondary | ICD-10-CM | POA: Diagnosis not present

## 2017-01-31 DIAGNOSIS — H9190 Unspecified hearing loss, unspecified ear: Secondary | ICD-10-CM | POA: Diagnosis not present

## 2017-01-31 DIAGNOSIS — Z87891 Personal history of nicotine dependence: Secondary | ICD-10-CM | POA: Diagnosis not present

## 2017-01-31 DIAGNOSIS — G629 Polyneuropathy, unspecified: Secondary | ICD-10-CM | POA: Diagnosis not present

## 2017-01-31 DIAGNOSIS — J209 Acute bronchitis, unspecified: Secondary | ICD-10-CM | POA: Diagnosis not present

## 2017-02-03 DIAGNOSIS — I700 Atherosclerosis of aorta: Secondary | ICD-10-CM | POA: Diagnosis not present

## 2017-02-03 DIAGNOSIS — G629 Polyneuropathy, unspecified: Secondary | ICD-10-CM | POA: Diagnosis not present

## 2017-02-03 DIAGNOSIS — M170 Bilateral primary osteoarthritis of knee: Secondary | ICD-10-CM | POA: Diagnosis not present

## 2017-02-03 DIAGNOSIS — Z87891 Personal history of nicotine dependence: Secondary | ICD-10-CM | POA: Diagnosis not present

## 2017-02-03 DIAGNOSIS — J439 Emphysema, unspecified: Secondary | ICD-10-CM | POA: Diagnosis not present

## 2017-02-03 DIAGNOSIS — I10 Essential (primary) hypertension: Secondary | ICD-10-CM | POA: Diagnosis not present

## 2017-02-03 DIAGNOSIS — J209 Acute bronchitis, unspecified: Secondary | ICD-10-CM | POA: Diagnosis not present

## 2017-02-03 DIAGNOSIS — J40 Bronchitis, not specified as acute or chronic: Secondary | ICD-10-CM | POA: Diagnosis not present

## 2017-02-03 DIAGNOSIS — G4700 Insomnia, unspecified: Secondary | ICD-10-CM | POA: Diagnosis not present

## 2017-02-03 DIAGNOSIS — Z7952 Long term (current) use of systemic steroids: Secondary | ICD-10-CM | POA: Diagnosis not present

## 2017-02-03 DIAGNOSIS — Z7982 Long term (current) use of aspirin: Secondary | ICD-10-CM | POA: Diagnosis not present

## 2017-02-03 DIAGNOSIS — H9190 Unspecified hearing loss, unspecified ear: Secondary | ICD-10-CM | POA: Diagnosis not present

## 2017-02-04 DIAGNOSIS — M170 Bilateral primary osteoarthritis of knee: Secondary | ICD-10-CM | POA: Diagnosis not present

## 2017-02-04 DIAGNOSIS — J439 Emphysema, unspecified: Secondary | ICD-10-CM | POA: Diagnosis not present

## 2017-02-04 DIAGNOSIS — Z7982 Long term (current) use of aspirin: Secondary | ICD-10-CM | POA: Diagnosis not present

## 2017-02-04 DIAGNOSIS — J209 Acute bronchitis, unspecified: Secondary | ICD-10-CM | POA: Diagnosis not present

## 2017-02-04 DIAGNOSIS — I700 Atherosclerosis of aorta: Secondary | ICD-10-CM | POA: Diagnosis not present

## 2017-02-04 DIAGNOSIS — G629 Polyneuropathy, unspecified: Secondary | ICD-10-CM | POA: Diagnosis not present

## 2017-02-04 DIAGNOSIS — G4700 Insomnia, unspecified: Secondary | ICD-10-CM | POA: Diagnosis not present

## 2017-02-04 DIAGNOSIS — Z7952 Long term (current) use of systemic steroids: Secondary | ICD-10-CM | POA: Diagnosis not present

## 2017-02-04 DIAGNOSIS — H9190 Unspecified hearing loss, unspecified ear: Secondary | ICD-10-CM | POA: Diagnosis not present

## 2017-02-04 DIAGNOSIS — Z87891 Personal history of nicotine dependence: Secondary | ICD-10-CM | POA: Diagnosis not present

## 2017-02-04 DIAGNOSIS — I10 Essential (primary) hypertension: Secondary | ICD-10-CM | POA: Diagnosis not present

## 2017-02-05 DIAGNOSIS — Z09 Encounter for follow-up examination after completed treatment for conditions other than malignant neoplasm: Secondary | ICD-10-CM | POA: Diagnosis not present

## 2017-02-05 DIAGNOSIS — H9190 Unspecified hearing loss, unspecified ear: Secondary | ICD-10-CM | POA: Diagnosis not present

## 2017-02-05 DIAGNOSIS — M170 Bilateral primary osteoarthritis of knee: Secondary | ICD-10-CM | POA: Diagnosis not present

## 2017-02-05 DIAGNOSIS — Z7952 Long term (current) use of systemic steroids: Secondary | ICD-10-CM | POA: Diagnosis not present

## 2017-02-05 DIAGNOSIS — Z87891 Personal history of nicotine dependence: Secondary | ICD-10-CM | POA: Diagnosis not present

## 2017-02-05 DIAGNOSIS — G629 Polyneuropathy, unspecified: Secondary | ICD-10-CM | POA: Diagnosis not present

## 2017-02-05 DIAGNOSIS — J209 Acute bronchitis, unspecified: Secondary | ICD-10-CM | POA: Diagnosis not present

## 2017-02-05 DIAGNOSIS — J439 Emphysema, unspecified: Secondary | ICD-10-CM | POA: Diagnosis not present

## 2017-02-05 DIAGNOSIS — I700 Atherosclerosis of aorta: Secondary | ICD-10-CM | POA: Diagnosis not present

## 2017-02-05 DIAGNOSIS — J40 Bronchitis, not specified as acute or chronic: Secondary | ICD-10-CM | POA: Diagnosis not present

## 2017-02-05 DIAGNOSIS — G4700 Insomnia, unspecified: Secondary | ICD-10-CM | POA: Diagnosis not present

## 2017-02-05 DIAGNOSIS — Z7982 Long term (current) use of aspirin: Secondary | ICD-10-CM | POA: Diagnosis not present

## 2017-02-05 DIAGNOSIS — I10 Essential (primary) hypertension: Secondary | ICD-10-CM | POA: Diagnosis not present

## 2017-02-06 DIAGNOSIS — I10 Essential (primary) hypertension: Secondary | ICD-10-CM | POA: Diagnosis not present

## 2017-02-06 DIAGNOSIS — J209 Acute bronchitis, unspecified: Secondary | ICD-10-CM | POA: Diagnosis not present

## 2017-02-06 DIAGNOSIS — G4700 Insomnia, unspecified: Secondary | ICD-10-CM | POA: Diagnosis not present

## 2017-02-06 DIAGNOSIS — Z7952 Long term (current) use of systemic steroids: Secondary | ICD-10-CM | POA: Diagnosis not present

## 2017-02-06 DIAGNOSIS — J439 Emphysema, unspecified: Secondary | ICD-10-CM | POA: Diagnosis not present

## 2017-02-06 DIAGNOSIS — H9190 Unspecified hearing loss, unspecified ear: Secondary | ICD-10-CM | POA: Diagnosis not present

## 2017-02-06 DIAGNOSIS — Z87891 Personal history of nicotine dependence: Secondary | ICD-10-CM | POA: Diagnosis not present

## 2017-02-06 DIAGNOSIS — I700 Atherosclerosis of aorta: Secondary | ICD-10-CM | POA: Diagnosis not present

## 2017-02-06 DIAGNOSIS — G629 Polyneuropathy, unspecified: Secondary | ICD-10-CM | POA: Diagnosis not present

## 2017-02-06 DIAGNOSIS — M170 Bilateral primary osteoarthritis of knee: Secondary | ICD-10-CM | POA: Diagnosis not present

## 2017-02-06 DIAGNOSIS — Z7982 Long term (current) use of aspirin: Secondary | ICD-10-CM | POA: Diagnosis not present

## 2017-02-10 DIAGNOSIS — Z7982 Long term (current) use of aspirin: Secondary | ICD-10-CM | POA: Diagnosis not present

## 2017-02-10 DIAGNOSIS — I10 Essential (primary) hypertension: Secondary | ICD-10-CM | POA: Diagnosis not present

## 2017-02-10 DIAGNOSIS — Z87891 Personal history of nicotine dependence: Secondary | ICD-10-CM | POA: Diagnosis not present

## 2017-02-10 DIAGNOSIS — G629 Polyneuropathy, unspecified: Secondary | ICD-10-CM | POA: Diagnosis not present

## 2017-02-10 DIAGNOSIS — J439 Emphysema, unspecified: Secondary | ICD-10-CM | POA: Diagnosis not present

## 2017-02-10 DIAGNOSIS — I700 Atherosclerosis of aorta: Secondary | ICD-10-CM | POA: Diagnosis not present

## 2017-02-10 DIAGNOSIS — M170 Bilateral primary osteoarthritis of knee: Secondary | ICD-10-CM | POA: Diagnosis not present

## 2017-02-10 DIAGNOSIS — H9190 Unspecified hearing loss, unspecified ear: Secondary | ICD-10-CM | POA: Diagnosis not present

## 2017-02-10 DIAGNOSIS — J209 Acute bronchitis, unspecified: Secondary | ICD-10-CM | POA: Diagnosis not present

## 2017-02-10 DIAGNOSIS — Z7952 Long term (current) use of systemic steroids: Secondary | ICD-10-CM | POA: Diagnosis not present

## 2017-02-10 DIAGNOSIS — G4700 Insomnia, unspecified: Secondary | ICD-10-CM | POA: Diagnosis not present

## 2017-02-11 DIAGNOSIS — Z87891 Personal history of nicotine dependence: Secondary | ICD-10-CM | POA: Diagnosis not present

## 2017-02-11 DIAGNOSIS — H9190 Unspecified hearing loss, unspecified ear: Secondary | ICD-10-CM | POA: Diagnosis not present

## 2017-02-11 DIAGNOSIS — Z7952 Long term (current) use of systemic steroids: Secondary | ICD-10-CM | POA: Diagnosis not present

## 2017-02-11 DIAGNOSIS — G629 Polyneuropathy, unspecified: Secondary | ICD-10-CM | POA: Diagnosis not present

## 2017-02-11 DIAGNOSIS — M170 Bilateral primary osteoarthritis of knee: Secondary | ICD-10-CM | POA: Diagnosis not present

## 2017-02-11 DIAGNOSIS — Z7982 Long term (current) use of aspirin: Secondary | ICD-10-CM | POA: Diagnosis not present

## 2017-02-11 DIAGNOSIS — I700 Atherosclerosis of aorta: Secondary | ICD-10-CM | POA: Diagnosis not present

## 2017-02-11 DIAGNOSIS — J439 Emphysema, unspecified: Secondary | ICD-10-CM | POA: Diagnosis not present

## 2017-02-11 DIAGNOSIS — I10 Essential (primary) hypertension: Secondary | ICD-10-CM | POA: Diagnosis not present

## 2017-02-11 DIAGNOSIS — G4700 Insomnia, unspecified: Secondary | ICD-10-CM | POA: Diagnosis not present

## 2017-02-11 DIAGNOSIS — J209 Acute bronchitis, unspecified: Secondary | ICD-10-CM | POA: Diagnosis not present

## 2017-02-12 DIAGNOSIS — I10 Essential (primary) hypertension: Secondary | ICD-10-CM | POA: Diagnosis not present

## 2017-02-12 DIAGNOSIS — H9190 Unspecified hearing loss, unspecified ear: Secondary | ICD-10-CM | POA: Diagnosis not present

## 2017-02-12 DIAGNOSIS — I700 Atherosclerosis of aorta: Secondary | ICD-10-CM | POA: Diagnosis not present

## 2017-02-12 DIAGNOSIS — Z87891 Personal history of nicotine dependence: Secondary | ICD-10-CM | POA: Diagnosis not present

## 2017-02-12 DIAGNOSIS — G629 Polyneuropathy, unspecified: Secondary | ICD-10-CM | POA: Diagnosis not present

## 2017-02-12 DIAGNOSIS — J439 Emphysema, unspecified: Secondary | ICD-10-CM | POA: Diagnosis not present

## 2017-02-12 DIAGNOSIS — Z7952 Long term (current) use of systemic steroids: Secondary | ICD-10-CM | POA: Diagnosis not present

## 2017-02-12 DIAGNOSIS — J209 Acute bronchitis, unspecified: Secondary | ICD-10-CM | POA: Diagnosis not present

## 2017-02-12 DIAGNOSIS — M170 Bilateral primary osteoarthritis of knee: Secondary | ICD-10-CM | POA: Diagnosis not present

## 2017-02-12 DIAGNOSIS — Z7982 Long term (current) use of aspirin: Secondary | ICD-10-CM | POA: Diagnosis not present

## 2017-02-12 DIAGNOSIS — G4700 Insomnia, unspecified: Secondary | ICD-10-CM | POA: Diagnosis not present

## 2017-02-13 DIAGNOSIS — J439 Emphysema, unspecified: Secondary | ICD-10-CM | POA: Diagnosis not present

## 2017-02-13 DIAGNOSIS — M170 Bilateral primary osteoarthritis of knee: Secondary | ICD-10-CM | POA: Diagnosis not present

## 2017-02-13 DIAGNOSIS — Z87891 Personal history of nicotine dependence: Secondary | ICD-10-CM | POA: Diagnosis not present

## 2017-02-13 DIAGNOSIS — G4700 Insomnia, unspecified: Secondary | ICD-10-CM | POA: Diagnosis not present

## 2017-02-13 DIAGNOSIS — J209 Acute bronchitis, unspecified: Secondary | ICD-10-CM | POA: Diagnosis not present

## 2017-02-13 DIAGNOSIS — G629 Polyneuropathy, unspecified: Secondary | ICD-10-CM | POA: Diagnosis not present

## 2017-02-13 DIAGNOSIS — Z7982 Long term (current) use of aspirin: Secondary | ICD-10-CM | POA: Diagnosis not present

## 2017-02-13 DIAGNOSIS — I10 Essential (primary) hypertension: Secondary | ICD-10-CM | POA: Diagnosis not present

## 2017-02-13 DIAGNOSIS — I700 Atherosclerosis of aorta: Secondary | ICD-10-CM | POA: Diagnosis not present

## 2017-02-13 DIAGNOSIS — H9190 Unspecified hearing loss, unspecified ear: Secondary | ICD-10-CM | POA: Diagnosis not present

## 2017-02-13 DIAGNOSIS — Z7952 Long term (current) use of systemic steroids: Secondary | ICD-10-CM | POA: Diagnosis not present

## 2017-02-18 DIAGNOSIS — J439 Emphysema, unspecified: Secondary | ICD-10-CM | POA: Diagnosis not present

## 2017-02-18 DIAGNOSIS — G4700 Insomnia, unspecified: Secondary | ICD-10-CM | POA: Diagnosis not present

## 2017-02-18 DIAGNOSIS — Z7982 Long term (current) use of aspirin: Secondary | ICD-10-CM | POA: Diagnosis not present

## 2017-02-18 DIAGNOSIS — G629 Polyneuropathy, unspecified: Secondary | ICD-10-CM | POA: Diagnosis not present

## 2017-02-18 DIAGNOSIS — Z7952 Long term (current) use of systemic steroids: Secondary | ICD-10-CM | POA: Diagnosis not present

## 2017-02-18 DIAGNOSIS — I10 Essential (primary) hypertension: Secondary | ICD-10-CM | POA: Diagnosis not present

## 2017-02-18 DIAGNOSIS — J209 Acute bronchitis, unspecified: Secondary | ICD-10-CM | POA: Diagnosis not present

## 2017-02-18 DIAGNOSIS — H353132 Nonexudative age-related macular degeneration, bilateral, intermediate dry stage: Secondary | ICD-10-CM | POA: Diagnosis not present

## 2017-02-18 DIAGNOSIS — I700 Atherosclerosis of aorta: Secondary | ICD-10-CM | POA: Diagnosis not present

## 2017-02-18 DIAGNOSIS — M170 Bilateral primary osteoarthritis of knee: Secondary | ICD-10-CM | POA: Diagnosis not present

## 2017-02-18 DIAGNOSIS — Z87891 Personal history of nicotine dependence: Secondary | ICD-10-CM | POA: Diagnosis not present

## 2017-02-18 DIAGNOSIS — H9190 Unspecified hearing loss, unspecified ear: Secondary | ICD-10-CM | POA: Diagnosis not present

## 2017-02-19 DIAGNOSIS — Z87891 Personal history of nicotine dependence: Secondary | ICD-10-CM | POA: Diagnosis not present

## 2017-02-19 DIAGNOSIS — Z7952 Long term (current) use of systemic steroids: Secondary | ICD-10-CM | POA: Diagnosis not present

## 2017-02-19 DIAGNOSIS — J209 Acute bronchitis, unspecified: Secondary | ICD-10-CM | POA: Diagnosis not present

## 2017-02-19 DIAGNOSIS — G4700 Insomnia, unspecified: Secondary | ICD-10-CM | POA: Diagnosis not present

## 2017-02-19 DIAGNOSIS — I700 Atherosclerosis of aorta: Secondary | ICD-10-CM | POA: Diagnosis not present

## 2017-02-19 DIAGNOSIS — I10 Essential (primary) hypertension: Secondary | ICD-10-CM | POA: Diagnosis not present

## 2017-02-19 DIAGNOSIS — M170 Bilateral primary osteoarthritis of knee: Secondary | ICD-10-CM | POA: Diagnosis not present

## 2017-02-19 DIAGNOSIS — G629 Polyneuropathy, unspecified: Secondary | ICD-10-CM | POA: Diagnosis not present

## 2017-02-19 DIAGNOSIS — J439 Emphysema, unspecified: Secondary | ICD-10-CM | POA: Diagnosis not present

## 2017-02-19 DIAGNOSIS — Z7982 Long term (current) use of aspirin: Secondary | ICD-10-CM | POA: Diagnosis not present

## 2017-02-19 DIAGNOSIS — H9190 Unspecified hearing loss, unspecified ear: Secondary | ICD-10-CM | POA: Diagnosis not present

## 2017-02-20 DIAGNOSIS — G629 Polyneuropathy, unspecified: Secondary | ICD-10-CM | POA: Diagnosis not present

## 2017-02-20 DIAGNOSIS — M170 Bilateral primary osteoarthritis of knee: Secondary | ICD-10-CM | POA: Diagnosis not present

## 2017-02-20 DIAGNOSIS — Z7982 Long term (current) use of aspirin: Secondary | ICD-10-CM | POA: Diagnosis not present

## 2017-02-20 DIAGNOSIS — Z87891 Personal history of nicotine dependence: Secondary | ICD-10-CM | POA: Diagnosis not present

## 2017-02-20 DIAGNOSIS — I700 Atherosclerosis of aorta: Secondary | ICD-10-CM | POA: Diagnosis not present

## 2017-02-20 DIAGNOSIS — J439 Emphysema, unspecified: Secondary | ICD-10-CM | POA: Diagnosis not present

## 2017-02-20 DIAGNOSIS — Z7952 Long term (current) use of systemic steroids: Secondary | ICD-10-CM | POA: Diagnosis not present

## 2017-02-20 DIAGNOSIS — H9190 Unspecified hearing loss, unspecified ear: Secondary | ICD-10-CM | POA: Diagnosis not present

## 2017-02-20 DIAGNOSIS — G4700 Insomnia, unspecified: Secondary | ICD-10-CM | POA: Diagnosis not present

## 2017-02-20 DIAGNOSIS — I10 Essential (primary) hypertension: Secondary | ICD-10-CM | POA: Diagnosis not present

## 2017-02-20 DIAGNOSIS — J209 Acute bronchitis, unspecified: Secondary | ICD-10-CM | POA: Diagnosis not present

## 2017-02-23 DIAGNOSIS — J209 Acute bronchitis, unspecified: Secondary | ICD-10-CM | POA: Diagnosis not present

## 2017-02-23 DIAGNOSIS — J439 Emphysema, unspecified: Secondary | ICD-10-CM | POA: Diagnosis not present

## 2017-02-25 DIAGNOSIS — Z87891 Personal history of nicotine dependence: Secondary | ICD-10-CM | POA: Diagnosis not present

## 2017-02-25 DIAGNOSIS — J439 Emphysema, unspecified: Secondary | ICD-10-CM | POA: Diagnosis not present

## 2017-02-25 DIAGNOSIS — M170 Bilateral primary osteoarthritis of knee: Secondary | ICD-10-CM | POA: Diagnosis not present

## 2017-02-25 DIAGNOSIS — J209 Acute bronchitis, unspecified: Secondary | ICD-10-CM | POA: Diagnosis not present

## 2017-02-25 DIAGNOSIS — I700 Atherosclerosis of aorta: Secondary | ICD-10-CM | POA: Diagnosis not present

## 2017-02-25 DIAGNOSIS — G629 Polyneuropathy, unspecified: Secondary | ICD-10-CM | POA: Diagnosis not present

## 2017-02-25 DIAGNOSIS — Z7952 Long term (current) use of systemic steroids: Secondary | ICD-10-CM | POA: Diagnosis not present

## 2017-02-25 DIAGNOSIS — Z7982 Long term (current) use of aspirin: Secondary | ICD-10-CM | POA: Diagnosis not present

## 2017-02-25 DIAGNOSIS — H9190 Unspecified hearing loss, unspecified ear: Secondary | ICD-10-CM | POA: Diagnosis not present

## 2017-02-25 DIAGNOSIS — I10 Essential (primary) hypertension: Secondary | ICD-10-CM | POA: Diagnosis not present

## 2017-02-25 DIAGNOSIS — G4700 Insomnia, unspecified: Secondary | ICD-10-CM | POA: Diagnosis not present

## 2017-02-26 DIAGNOSIS — I700 Atherosclerosis of aorta: Secondary | ICD-10-CM | POA: Diagnosis not present

## 2017-02-26 DIAGNOSIS — Z7982 Long term (current) use of aspirin: Secondary | ICD-10-CM | POA: Diagnosis not present

## 2017-02-26 DIAGNOSIS — I10 Essential (primary) hypertension: Secondary | ICD-10-CM | POA: Diagnosis not present

## 2017-02-26 DIAGNOSIS — M170 Bilateral primary osteoarthritis of knee: Secondary | ICD-10-CM | POA: Diagnosis not present

## 2017-02-26 DIAGNOSIS — G4700 Insomnia, unspecified: Secondary | ICD-10-CM | POA: Diagnosis not present

## 2017-02-26 DIAGNOSIS — J209 Acute bronchitis, unspecified: Secondary | ICD-10-CM | POA: Diagnosis not present

## 2017-02-26 DIAGNOSIS — J439 Emphysema, unspecified: Secondary | ICD-10-CM | POA: Diagnosis not present

## 2017-02-26 DIAGNOSIS — Z7952 Long term (current) use of systemic steroids: Secondary | ICD-10-CM | POA: Diagnosis not present

## 2017-02-26 DIAGNOSIS — Z87891 Personal history of nicotine dependence: Secondary | ICD-10-CM | POA: Diagnosis not present

## 2017-02-26 DIAGNOSIS — G629 Polyneuropathy, unspecified: Secondary | ICD-10-CM | POA: Diagnosis not present

## 2017-02-26 DIAGNOSIS — H9190 Unspecified hearing loss, unspecified ear: Secondary | ICD-10-CM | POA: Diagnosis not present

## 2017-02-26 DIAGNOSIS — J40 Bronchitis, not specified as acute or chronic: Secondary | ICD-10-CM | POA: Diagnosis not present

## 2017-02-27 DIAGNOSIS — M170 Bilateral primary osteoarthritis of knee: Secondary | ICD-10-CM | POA: Diagnosis not present

## 2017-02-27 DIAGNOSIS — Z7952 Long term (current) use of systemic steroids: Secondary | ICD-10-CM | POA: Diagnosis not present

## 2017-02-27 DIAGNOSIS — Z87891 Personal history of nicotine dependence: Secondary | ICD-10-CM | POA: Diagnosis not present

## 2017-02-27 DIAGNOSIS — G4700 Insomnia, unspecified: Secondary | ICD-10-CM | POA: Diagnosis not present

## 2017-02-27 DIAGNOSIS — I700 Atherosclerosis of aorta: Secondary | ICD-10-CM | POA: Diagnosis not present

## 2017-02-27 DIAGNOSIS — I10 Essential (primary) hypertension: Secondary | ICD-10-CM | POA: Diagnosis not present

## 2017-02-27 DIAGNOSIS — J439 Emphysema, unspecified: Secondary | ICD-10-CM | POA: Diagnosis not present

## 2017-02-27 DIAGNOSIS — Z7982 Long term (current) use of aspirin: Secondary | ICD-10-CM | POA: Diagnosis not present

## 2017-02-27 DIAGNOSIS — H9190 Unspecified hearing loss, unspecified ear: Secondary | ICD-10-CM | POA: Diagnosis not present

## 2017-02-27 DIAGNOSIS — G629 Polyneuropathy, unspecified: Secondary | ICD-10-CM | POA: Diagnosis not present

## 2017-02-27 DIAGNOSIS — J209 Acute bronchitis, unspecified: Secondary | ICD-10-CM | POA: Diagnosis not present

## 2017-03-02 DIAGNOSIS — H9190 Unspecified hearing loss, unspecified ear: Secondary | ICD-10-CM | POA: Diagnosis not present

## 2017-03-02 DIAGNOSIS — I10 Essential (primary) hypertension: Secondary | ICD-10-CM | POA: Diagnosis not present

## 2017-03-02 DIAGNOSIS — G629 Polyneuropathy, unspecified: Secondary | ICD-10-CM | POA: Diagnosis not present

## 2017-03-02 DIAGNOSIS — G4700 Insomnia, unspecified: Secondary | ICD-10-CM | POA: Diagnosis not present

## 2017-03-02 DIAGNOSIS — I700 Atherosclerosis of aorta: Secondary | ICD-10-CM | POA: Diagnosis not present

## 2017-03-02 DIAGNOSIS — Z87891 Personal history of nicotine dependence: Secondary | ICD-10-CM | POA: Diagnosis not present

## 2017-03-02 DIAGNOSIS — Z7982 Long term (current) use of aspirin: Secondary | ICD-10-CM | POA: Diagnosis not present

## 2017-03-02 DIAGNOSIS — J439 Emphysema, unspecified: Secondary | ICD-10-CM | POA: Diagnosis not present

## 2017-03-02 DIAGNOSIS — Z7952 Long term (current) use of systemic steroids: Secondary | ICD-10-CM | POA: Diagnosis not present

## 2017-03-02 DIAGNOSIS — M170 Bilateral primary osteoarthritis of knee: Secondary | ICD-10-CM | POA: Diagnosis not present

## 2017-03-02 DIAGNOSIS — J209 Acute bronchitis, unspecified: Secondary | ICD-10-CM | POA: Diagnosis not present

## 2017-03-03 ENCOUNTER — Encounter: Payer: Self-pay | Admitting: Diagnostic Neuroimaging

## 2017-03-03 ENCOUNTER — Encounter: Payer: Self-pay | Admitting: *Deleted

## 2017-03-03 ENCOUNTER — Encounter (INDEPENDENT_AMBULATORY_CARE_PROVIDER_SITE_OTHER): Payer: Self-pay

## 2017-03-03 ENCOUNTER — Ambulatory Visit: Payer: Medicare Other | Admitting: Diagnostic Neuroimaging

## 2017-03-03 VITALS — BP 173/65 | HR 54 | Ht 71.0 in | Wt 195.4 lb

## 2017-03-03 DIAGNOSIS — R413 Other amnesia: Secondary | ICD-10-CM

## 2017-03-03 NOTE — Progress Notes (Signed)
[facility]  PATIENT: Marvin Wright DOB: October 15, 1922  REFERRING CLINICIAN: Aris Everts HISTORY FROM: patient and daughter in law REASON FOR VISIT: new consult    HISTORICAL  CHIEF COMPLAINT:  Chief Complaint  Patient presents with  . NP Dr. Kennith Maes  . Memory Loss    Taking aricept for cognitive decline.  Pt driving went in wrong direction (landed in [place]).     HISTORY OF PRESENT ILLNESS:   84 year old male with hypertension, arthritis, insomnia, here for evaluation of memory loss, dementia evaluation and driving evaluation.  Patient does endorse some mild memory problems but he does not think these are that severe.  He is the primary caregiver for his wife who has Alzheimer's.  He also has several family members who help him and his wife.  He takes care of all of household activities.  He drives, shops, manages finances.  He is a former Recruitment consultant, Freight forwarder, high functioning, retired at age 84 years old.  He then pursued other part-time occupations through his 68s.  Beginning of December 2018, patient had some upper respiratory infection symptoms, and went to the pharmacy to pick up a prescription.  However by the time he went to get this medication it had become dark.  On his way home he got confused and lost with the directions.  He continued driving and ended up [place] cow [place], and was unable to exit.  When he got out of the car he dropped his keys and lost them.  He then ended up spending the night in the vehicle and the next morning when the workers came in, they were able to reconnect him with his family.  That evening when patient did not return home.  He and his family had contacted the police and were searching all over town to find him.  Patient states that this event occurred due to his poor nighttime vision.  He does not feel that memory loss was a factor.  He feels that he is ok to drive.  Of note patient has strong family history of  Alzheimer's disease in his mother, maternal grandmother, maternal aunts.    REVIEW OF SYSTEMS: Full 14 system review of systems performed and negative with exception of: Swelling in legs hearing loss loss of vision easy bruising joint pain not enough sleep insomnia restless legs urination problems per  ALLERGIES: Allergies  Allergen Reactions  . Penicillins     HOME MEDICATIONS: Outpatient Medications Prior to Visit  Medication Sig Dispense Refill  . donepezil (ARICEPT) 5 MG tablet Take 5 mg by mouth at bedtime.    . furosemide (LASIX) 20 MG tablet Take 20 mg by mouth daily as needed.    . gabapentin (NEURONTIN) 300 MG capsule Take 300 mg by mouth at bedtime.    Marland Kitchen omeprazole (PRILOSEC) 20 MG capsule Take 20 mg by mouth daily.    . quinapril-hydrochlorothiazide (ACCURETIC) 20-12.5 MG tablet Take 1 tablet by mouth 2 (two) times daily.    Marland Kitchen rOPINIRole (REQUIP) 2 MG tablet Take 2 mg by mouth at bedtime.    . simvastatin (ZOCOR) 20 MG tablet Take 20 mg by mouth daily at 6 PM.    . Suvorexant (BELSOMRA) 15 MG TABS Take by mouth at bedtime.    . Wheat Dextrin (BENEFIBER PO) Take by mouth.    Marland Kitchen albuterol (PROVENTIL) (2.5 MG/3ML) 0.083% nebulizer solution Take 2.5 mg by nebulization 3 (three) times daily.    Marland Kitchen aspirin EC 81 MG  tablet Take 81 mg by mouth daily.    . Glucosamine-Chondroit-Vit C-Mn (GLUCOSAMINE 1500 COMPLEX) CAPS Take by mouth daily.     No facility-administered medications prior to visit.     PAST MEDICAL HISTORY: Past Medical History:  Diagnosis Date  . Hearing loss    Left ear  . Hypercholesterolemia   . Hypertension   . Osteoarthritis   . Periodic limb movement disorder   . Peripheral neuropathy   . Prostate CA [facility])     PAST SURGICAL HISTORY: Past Surgical History:  Procedure Laterality Date  . APPENDECTOMY    . PENILE PROSTHESIS IMPLANT    . PROSTATECTOMY    . TONSILLECTOMY AND ADENOIDECTOMY      FAMILY HISTORY: Family History  Problem Relation Age of  Onset  . CVA Mother   . Alzheimer's disease Mother   . Lung cancer Father   . Liver cancer Father     SOCIAL HISTORY:  Social History   Socioeconomic History  . Marital status: Married    Spouse name: Not on file  . Number of children: Not on file  . Years of education: Not on file  . Highest education level: Not on file  Social Needs  . Financial resource strain: Not on file  . Food insecurity - worry: Not on file  . Food insecurity - inability: Not on file  . Transportation needs - medical: Not on file  . Transportation needs - non-medical: Not on file  Occupational History  . Not on file  Tobacco Use  . Smoking status: Former Smoker    Last attempt to quit: 02/25/1988    Years since quitting: 29.0  . Smokeless tobacco: Never Used  Substance and Sexual Activity  . Alcohol use: No    Frequency: Never  . Drug use: No  . Sexual activity: Not on file  Other Topics Concern  . Not on file  Social History Narrative   Lives at home with spouse (who has ALZ).  Education HS.  Pt is retired.   Cafffeine once week.       PHYSICAL EXAM  GENERAL EXAM/CONSTITUTIONAL: Vitals:  Vitals:   03/03/17 1256  BP: (!) 173/65  Pulse: (!) 54  Weight: 195 lb 6.4 oz (88.6 kg)  Height: 5\' 11"  (1.803 m)     Body mass index is 27.25 kg/m. No exam data present  Patient is in no distress; well developed, nourished and groomed; neck is supple  CARDIOVASCULAR:  Examination of carotid arteries is normal; no carotid bruits  Regular rate and rhythm, no murmurs  Examination of peripheral vascular system by observation and palpation is normal  EYES:  Ophthalmoscopic exam of optic discs and posterior segments is normal; no papilledema or hemorrhages  MUSCULOSKELETAL:  Gait, strength, tone, movements noted in Neurologic exam below  NEUROLOGIC: MENTAL STATUS:  MMSE - Rea Exam 03/03/2017  Orientation to time 5  Orientation to Place 5  Registration 3  Attention/  Calculation 2  Recall 3  Language- name 2 objects 2  Language- repeat 0  Language- follow 3 step command 3  Language- read & follow direction 1  Write a sentence 1  Copy design 0  Total score 25    awake, alert, oriented to person, place and time  recent and remote memory intact  normal attention and concentration; EXCEPT DER SERIAL 7'S  language fluent, comprehension intact, naming intact,   fund of knowledge appropriate  SLIGHTLY TANGENTIAL  CRANIAL NERVE:   2nd -  no papilledema on fundoscopic exam  2nd, 3rd, 4th, 6th - pupils equal and reactive to light, visual fields full to confrontation, extraocular muscles intact, no nystagmus; SACCADIC BREAKDOWN OF SMOOTH PURSUIT WITH ANTI-SACCADES  5th - facial sensation symmetric  7th - facial strength symmetric  8th - hearing intact  9th - palate elevates symmetrically, uvula midline  11th - shoulder shrug symmetric  12th - tongue protrusion midline  MOTOR:   normal bulk and tone, full strength in the BUE, BLE  SENSORY:   normal and symmetric to light touch, temperature, vibration  COORDINATION:   finger-nose-finger, fine finger movements SLOW; DYSMETRIA ON LEFT UPPER EXT  REFLEXES:   deep tendon reflexes present and symmetric  GAIT/STATION:   narrow based gait; STOOPED POSTURE; USES SINGLE POINT CANE    DIAGNOSTIC DATA (LABS, IMAGING, TESTING) - I reviewed patient records, labs, notes, testing and imaging myself where available.  No results found for: WBC, HGB, HCT, MCV, PLT No results found for: NA, K, CL, CO2, GLUCOSE, BUN, CREATININE, CALCIUM, PROT, ALBUMIN, AST, ALT, ALKPHOS, BILITOT, GFRNONAA, GFRAA No results found for: CHOL, HDL, LDLCALC, LDLDIRECT, TRIG, CHOLHDL No results found for: HGBA1C No results found for: VITAMINB12 No results found for: TSH  01/29/15 MRI brain [I reviewed images myself and agree with interpretation. -VRP]  - No acute or reversible finding. No specific cause of  hearing loss is identified. Mild age related volume loss and chronic small vessel ischemic changes throughout the brain as outlined above. No vestibular schwannoma.    ASSESSMENT AND PLAN  84 y.o. year old male here with memory loss, navigation problem, decreased vision, arthritis, balance and gait difficulty, with episode of getting lost while driving at nighttime 1 month ago.  MMSE 25 out of 30.  No frontal release signs.  Could represent mild cognitive impairment versus onset of mild dementia.  Also with other physical limitations which could make driving dangerous.   Dx:  1. Memory loss      PLAN:  I spent 60 minutes of face to face time with patient. Greater than 50% of time was spent in counseling and coordination of care with patient. In summary we discussed:   MEMORY LOSS (MCI vs mild dementia) - I recommend that patient transition away from driving (due to memory loss, decreased vision, decreased hearing, knee arthritis, balance difficulty); patient and family may consider independent driving evaluation or DMV retest if they disagree with my recommendation - increase safety and supervision - continue donepezil  HYPERTENSION - follow up BP with PCP  Return in about 6 months (around 08/31/2017).      Penni Bombard, MD 0/09/1446, 1:85 PM Certified in Neurology, Neurophysiology and Neuroimaging  [facility] [address], [place], [place] [address] [phone]

## 2017-03-03 NOTE — Patient Instructions (Signed)
MEMORY LOSS (MCI vs mild dementia) - I recommend that patient transition away from driving (due to memory loss, decreased vision, decreased hearing, knee arthritis, balance difficulty); patient and family may consider independent driving evaluation or DMV retest if they disagree with my recommendation - increase safety and supervision - continue donepezil  HYPERTENSION - follow up BP with PCP

## 2017-03-04 DIAGNOSIS — I700 Atherosclerosis of aorta: Secondary | ICD-10-CM | POA: Diagnosis not present

## 2017-03-04 DIAGNOSIS — G629 Polyneuropathy, unspecified: Secondary | ICD-10-CM | POA: Diagnosis not present

## 2017-03-04 DIAGNOSIS — M1712 Unilateral primary osteoarthritis, left knee: Secondary | ICD-10-CM | POA: Diagnosis not present

## 2017-03-04 DIAGNOSIS — J209 Acute bronchitis, unspecified: Secondary | ICD-10-CM | POA: Diagnosis not present

## 2017-03-04 DIAGNOSIS — G4700 Insomnia, unspecified: Secondary | ICD-10-CM | POA: Diagnosis not present

## 2017-03-04 DIAGNOSIS — I10 Essential (primary) hypertension: Secondary | ICD-10-CM | POA: Diagnosis not present

## 2017-03-04 DIAGNOSIS — H9190 Unspecified hearing loss, unspecified ear: Secondary | ICD-10-CM | POA: Diagnosis not present

## 2017-03-04 DIAGNOSIS — Z7952 Long term (current) use of systemic steroids: Secondary | ICD-10-CM | POA: Diagnosis not present

## 2017-03-04 DIAGNOSIS — Z7982 Long term (current) use of aspirin: Secondary | ICD-10-CM | POA: Diagnosis not present

## 2017-03-04 DIAGNOSIS — M1711 Unilateral primary osteoarthritis, right knee: Secondary | ICD-10-CM | POA: Diagnosis not present

## 2017-03-04 DIAGNOSIS — J439 Emphysema, unspecified: Secondary | ICD-10-CM | POA: Diagnosis not present

## 2017-03-04 DIAGNOSIS — Z87891 Personal history of nicotine dependence: Secondary | ICD-10-CM | POA: Diagnosis not present

## 2017-03-04 DIAGNOSIS — M170 Bilateral primary osteoarthritis of knee: Secondary | ICD-10-CM | POA: Diagnosis not present

## 2017-03-13 DIAGNOSIS — Z1339 Encounter for screening examination for other mental health and behavioral disorders: Secondary | ICD-10-CM | POA: Diagnosis not present

## 2017-03-13 DIAGNOSIS — Z0000 Encounter for general adult medical examination without abnormal findings: Secondary | ICD-10-CM | POA: Diagnosis not present

## 2017-03-13 DIAGNOSIS — Z1331 Encounter for screening for depression: Secondary | ICD-10-CM | POA: Diagnosis not present

## 2017-03-13 DIAGNOSIS — Z9181 History of falling: Secondary | ICD-10-CM | POA: Diagnosis not present

## 2017-03-27 DIAGNOSIS — M79672 Pain in left foot: Secondary | ICD-10-CM | POA: Diagnosis not present

## 2017-03-27 DIAGNOSIS — M79671 Pain in right foot: Secondary | ICD-10-CM | POA: Diagnosis not present

## 2017-03-27 DIAGNOSIS — G603 Idiopathic progressive neuropathy: Secondary | ICD-10-CM | POA: Diagnosis not present

## 2017-03-29 DIAGNOSIS — J40 Bronchitis, not specified as acute or chronic: Secondary | ICD-10-CM | POA: Diagnosis not present

## 2017-04-09 DIAGNOSIS — E560 Deficiency of vitamin E: Secondary | ICD-10-CM | POA: Diagnosis not present

## 2017-04-09 DIAGNOSIS — G603 Idiopathic progressive neuropathy: Secondary | ICD-10-CM | POA: Diagnosis not present

## 2017-04-09 DIAGNOSIS — M79671 Pain in right foot: Secondary | ICD-10-CM | POA: Diagnosis not present

## 2017-04-09 DIAGNOSIS — M79672 Pain in left foot: Secondary | ICD-10-CM | POA: Diagnosis not present

## 2017-04-26 DIAGNOSIS — J40 Bronchitis, not specified as acute or chronic: Secondary | ICD-10-CM | POA: Diagnosis not present

## 2017-05-22 DIAGNOSIS — G4761 Periodic limb movement disorder: Secondary | ICD-10-CM | POA: Diagnosis not present

## 2017-05-27 DIAGNOSIS — J40 Bronchitis, not specified as acute or chronic: Secondary | ICD-10-CM | POA: Diagnosis not present

## 2017-06-02 DIAGNOSIS — M1712 Unilateral primary osteoarthritis, left knee: Secondary | ICD-10-CM | POA: Diagnosis not present

## 2017-06-02 DIAGNOSIS — E560 Deficiency of vitamin E: Secondary | ICD-10-CM | POA: Diagnosis not present

## 2017-06-02 DIAGNOSIS — M79671 Pain in right foot: Secondary | ICD-10-CM | POA: Diagnosis not present

## 2017-06-02 DIAGNOSIS — G4700 Insomnia, unspecified: Secondary | ICD-10-CM | POA: Diagnosis not present

## 2017-06-02 DIAGNOSIS — G603 Idiopathic progressive neuropathy: Secondary | ICD-10-CM | POA: Diagnosis not present

## 2017-06-02 DIAGNOSIS — M1711 Unilateral primary osteoarthritis, right knee: Secondary | ICD-10-CM | POA: Diagnosis not present

## 2017-06-03 DIAGNOSIS — S8991XA Unspecified injury of right lower leg, initial encounter: Secondary | ICD-10-CM | POA: Diagnosis not present

## 2017-06-03 DIAGNOSIS — G8911 Acute pain due to trauma: Secondary | ICD-10-CM | POA: Diagnosis not present

## 2017-06-03 DIAGNOSIS — S41112A Laceration without foreign body of left upper arm, initial encounter: Secondary | ICD-10-CM | POA: Diagnosis not present

## 2017-06-03 DIAGNOSIS — S8001XA Contusion of right knee, initial encounter: Secondary | ICD-10-CM | POA: Diagnosis not present

## 2017-06-03 DIAGNOSIS — S4990XA Unspecified injury of shoulder and upper arm, unspecified arm, initial encounter: Secondary | ICD-10-CM | POA: Diagnosis not present

## 2017-06-10 DIAGNOSIS — H5203 Hypermetropia, bilateral: Secondary | ICD-10-CM | POA: Diagnosis not present

## 2017-06-10 DIAGNOSIS — H353132 Nonexudative age-related macular degeneration, bilateral, intermediate dry stage: Secondary | ICD-10-CM | POA: Diagnosis not present

## 2017-06-26 DIAGNOSIS — J40 Bronchitis, not specified as acute or chronic: Secondary | ICD-10-CM | POA: Diagnosis not present

## 2017-07-01 DIAGNOSIS — M170 Bilateral primary osteoarthritis of knee: Secondary | ICD-10-CM | POA: Diagnosis not present

## 2017-07-09 DIAGNOSIS — Z01818 Encounter for other preprocedural examination: Secondary | ICD-10-CM | POA: Diagnosis not present

## 2017-07-27 DIAGNOSIS — J40 Bronchitis, not specified as acute or chronic: Secondary | ICD-10-CM | POA: Diagnosis not present

## 2017-08-18 DIAGNOSIS — G4700 Insomnia, unspecified: Secondary | ICD-10-CM | POA: Diagnosis not present

## 2017-08-18 DIAGNOSIS — M79671 Pain in right foot: Secondary | ICD-10-CM | POA: Diagnosis not present

## 2017-08-18 DIAGNOSIS — G603 Idiopathic progressive neuropathy: Secondary | ICD-10-CM | POA: Diagnosis not present

## 2017-08-18 DIAGNOSIS — R208 Other disturbances of skin sensation: Secondary | ICD-10-CM | POA: Diagnosis not present

## 2017-08-20 DIAGNOSIS — L84 Corns and callosities: Secondary | ICD-10-CM | POA: Diagnosis not present

## 2017-08-20 DIAGNOSIS — B351 Tinea unguium: Secondary | ICD-10-CM | POA: Diagnosis not present

## 2017-08-20 DIAGNOSIS — R600 Localized edema: Secondary | ICD-10-CM | POA: Diagnosis not present

## 2017-08-21 DIAGNOSIS — K4090 Unilateral inguinal hernia, without obstruction or gangrene, not specified as recurrent: Secondary | ICD-10-CM | POA: Diagnosis not present

## 2017-08-26 DIAGNOSIS — J40 Bronchitis, not specified as acute or chronic: Secondary | ICD-10-CM | POA: Diagnosis not present

## 2017-08-31 DIAGNOSIS — K4090 Unilateral inguinal hernia, without obstruction or gangrene, not specified as recurrent: Secondary | ICD-10-CM | POA: Diagnosis not present

## 2017-08-31 DIAGNOSIS — M1288 Other specific arthropathies, not elsewhere classified, other specified site: Secondary | ICD-10-CM | POA: Diagnosis not present

## 2017-09-01 ENCOUNTER — Ambulatory Visit: Payer: Medicare Other | Admitting: Diagnostic Neuroimaging

## 2017-09-01 DIAGNOSIS — M1712 Unilateral primary osteoarthritis, left knee: Secondary | ICD-10-CM | POA: Diagnosis not present

## 2017-09-01 DIAGNOSIS — M1711 Unilateral primary osteoarthritis, right knee: Secondary | ICD-10-CM | POA: Diagnosis not present

## 2017-09-26 DIAGNOSIS — J40 Bronchitis, not specified as acute or chronic: Secondary | ICD-10-CM | POA: Diagnosis not present

## 2017-09-29 DIAGNOSIS — L570 Actinic keratosis: Secondary | ICD-10-CM | POA: Diagnosis not present

## 2017-10-27 DIAGNOSIS — J40 Bronchitis, not specified as acute or chronic: Secondary | ICD-10-CM | POA: Diagnosis not present

## 2017-11-09 DIAGNOSIS — Z23 Encounter for immunization: Secondary | ICD-10-CM | POA: Diagnosis not present

## 2017-11-09 DIAGNOSIS — G4761 Periodic limb movement disorder: Secondary | ICD-10-CM | POA: Diagnosis not present

## 2017-11-12 DIAGNOSIS — E871 Hypo-osmolality and hyponatremia: Secondary | ICD-10-CM | POA: Diagnosis not present

## 2017-11-12 DIAGNOSIS — I712 Thoracic aortic aneurysm, without rupture: Secondary | ICD-10-CM | POA: Diagnosis not present

## 2017-11-12 DIAGNOSIS — R609 Edema, unspecified: Secondary | ICD-10-CM | POA: Diagnosis not present

## 2017-11-12 DIAGNOSIS — J449 Chronic obstructive pulmonary disease, unspecified: Secondary | ICD-10-CM | POA: Diagnosis not present

## 2017-11-12 DIAGNOSIS — Z79899 Other long term (current) drug therapy: Secondary | ICD-10-CM | POA: Diagnosis not present

## 2017-11-12 DIAGNOSIS — R0600 Dyspnea, unspecified: Secondary | ICD-10-CM | POA: Diagnosis not present

## 2017-11-12 DIAGNOSIS — G309 Alzheimer's disease, unspecified: Secondary | ICD-10-CM | POA: Diagnosis not present

## 2017-11-12 DIAGNOSIS — I10 Essential (primary) hypertension: Secondary | ICD-10-CM | POA: Diagnosis not present

## 2017-11-12 DIAGNOSIS — E7800 Pure hypercholesterolemia, unspecified: Secondary | ICD-10-CM | POA: Diagnosis not present

## 2017-11-12 DIAGNOSIS — R7989 Other specified abnormal findings of blood chemistry: Secondary | ICD-10-CM | POA: Diagnosis not present

## 2017-11-12 DIAGNOSIS — R0902 Hypoxemia: Secondary | ICD-10-CM | POA: Diagnosis not present

## 2017-11-12 DIAGNOSIS — R0602 Shortness of breath: Secondary | ICD-10-CM | POA: Diagnosis not present

## 2017-11-12 DIAGNOSIS — R0601 Orthopnea: Secondary | ICD-10-CM | POA: Diagnosis not present

## 2017-11-12 DIAGNOSIS — K219 Gastro-esophageal reflux disease without esophagitis: Secondary | ICD-10-CM | POA: Diagnosis not present

## 2017-11-12 DIAGNOSIS — Z87891 Personal history of nicotine dependence: Secondary | ICD-10-CM | POA: Diagnosis not present

## 2017-11-13 DIAGNOSIS — I10 Essential (primary) hypertension: Secondary | ICD-10-CM | POA: Diagnosis not present

## 2017-11-13 DIAGNOSIS — J40 Bronchitis, not specified as acute or chronic: Secondary | ICD-10-CM | POA: Diagnosis not present

## 2017-11-13 DIAGNOSIS — E871 Hypo-osmolality and hyponatremia: Secondary | ICD-10-CM

## 2017-11-13 DIAGNOSIS — R0902 Hypoxemia: Secondary | ICD-10-CM | POA: Diagnosis not present

## 2017-11-13 DIAGNOSIS — R748 Abnormal levels of other serum enzymes: Secondary | ICD-10-CM | POA: Diagnosis not present

## 2017-11-13 DIAGNOSIS — R0600 Dyspnea, unspecified: Secondary | ICD-10-CM | POA: Diagnosis not present

## 2017-11-17 DIAGNOSIS — R252 Cramp and spasm: Secondary | ICD-10-CM | POA: Diagnosis not present

## 2017-11-17 DIAGNOSIS — G4700 Insomnia, unspecified: Secondary | ICD-10-CM | POA: Diagnosis not present

## 2017-11-17 DIAGNOSIS — R208 Other disturbances of skin sensation: Secondary | ICD-10-CM | POA: Diagnosis not present

## 2017-11-17 DIAGNOSIS — G603 Idiopathic progressive neuropathy: Secondary | ICD-10-CM | POA: Diagnosis not present

## 2017-11-23 DIAGNOSIS — Z09 Encounter for follow-up examination after completed treatment for conditions other than malignant neoplasm: Secondary | ICD-10-CM | POA: Diagnosis not present

## 2017-11-23 DIAGNOSIS — E871 Hypo-osmolality and hyponatremia: Secondary | ICD-10-CM | POA: Diagnosis not present

## 2017-11-23 DIAGNOSIS — R0602 Shortness of breath: Secondary | ICD-10-CM | POA: Diagnosis not present

## 2018-01-05 DIAGNOSIS — R109 Unspecified abdominal pain: Secondary | ICD-10-CM | POA: Diagnosis not present

## 2018-02-16 DIAGNOSIS — M170 Bilateral primary osteoarthritis of knee: Secondary | ICD-10-CM | POA: Diagnosis not present

## 2018-02-16 DIAGNOSIS — M1711 Unilateral primary osteoarthritis, right knee: Secondary | ICD-10-CM | POA: Diagnosis not present

## 2018-02-16 DIAGNOSIS — M1712 Unilateral primary osteoarthritis, left knee: Secondary | ICD-10-CM | POA: Diagnosis not present

## 2018-02-23 DIAGNOSIS — M79671 Pain in right foot: Secondary | ICD-10-CM | POA: Diagnosis not present

## 2018-02-23 DIAGNOSIS — R208 Other disturbances of skin sensation: Secondary | ICD-10-CM | POA: Diagnosis not present

## 2018-02-23 DIAGNOSIS — G603 Idiopathic progressive neuropathy: Secondary | ICD-10-CM | POA: Diagnosis not present

## 2018-02-23 DIAGNOSIS — M79672 Pain in left foot: Secondary | ICD-10-CM | POA: Diagnosis not present

## 2018-03-15 DIAGNOSIS — J180 Bronchopneumonia, unspecified organism: Secondary | ICD-10-CM | POA: Diagnosis not present

## 2018-03-29 DIAGNOSIS — Z880 Allergy status to penicillin: Secondary | ICD-10-CM | POA: Diagnosis not present

## 2018-03-29 DIAGNOSIS — I160 Hypertensive urgency: Secondary | ICD-10-CM | POA: Diagnosis not present

## 2018-03-29 DIAGNOSIS — K219 Gastro-esophageal reflux disease without esophagitis: Secondary | ICD-10-CM | POA: Diagnosis not present

## 2018-03-29 DIAGNOSIS — I10 Essential (primary) hypertension: Secondary | ICD-10-CM | POA: Diagnosis not present

## 2018-03-29 DIAGNOSIS — Z79899 Other long term (current) drug therapy: Secondary | ICD-10-CM | POA: Diagnosis not present

## 2018-03-29 DIAGNOSIS — I361 Nonrheumatic tricuspid (valve) insufficiency: Secondary | ICD-10-CM | POA: Diagnosis not present

## 2018-03-29 DIAGNOSIS — R531 Weakness: Secondary | ICD-10-CM | POA: Diagnosis not present

## 2018-03-29 DIAGNOSIS — I6523 Occlusion and stenosis of bilateral carotid arteries: Secondary | ICD-10-CM | POA: Diagnosis not present

## 2018-03-29 DIAGNOSIS — M1990 Unspecified osteoarthritis, unspecified site: Secondary | ICD-10-CM | POA: Diagnosis not present

## 2018-03-29 DIAGNOSIS — E7800 Pure hypercholesterolemia, unspecified: Secondary | ICD-10-CM | POA: Diagnosis not present

## 2018-03-29 DIAGNOSIS — Z7982 Long term (current) use of aspirin: Secondary | ICD-10-CM | POA: Diagnosis not present

## 2018-03-29 DIAGNOSIS — I340 Nonrheumatic mitral (valve) insufficiency: Secondary | ICD-10-CM | POA: Diagnosis not present

## 2018-03-29 DIAGNOSIS — D170 Benign lipomatous neoplasm of skin and subcutaneous tissue of head, face and neck: Secondary | ICD-10-CM | POA: Diagnosis not present

## 2018-03-29 DIAGNOSIS — G5631 Lesion of radial nerve, right upper limb: Secondary | ICD-10-CM | POA: Diagnosis not present

## 2018-03-29 DIAGNOSIS — I351 Nonrheumatic aortic (valve) insufficiency: Secondary | ICD-10-CM | POA: Diagnosis not present

## 2018-03-29 DIAGNOSIS — G8191 Hemiplegia, unspecified affecting right dominant side: Secondary | ICD-10-CM | POA: Diagnosis not present

## 2018-04-08 DIAGNOSIS — I6990 Unspecified sequelae of unspecified cerebrovascular disease: Secondary | ICD-10-CM | POA: Diagnosis not present

## 2018-04-08 DIAGNOSIS — L84 Corns and callosities: Secondary | ICD-10-CM | POA: Diagnosis not present

## 2018-04-13 DIAGNOSIS — M170 Bilateral primary osteoarthritis of knee: Secondary | ICD-10-CM | POA: Diagnosis not present

## 2018-04-13 DIAGNOSIS — Z7951 Long term (current) use of inhaled steroids: Secondary | ICD-10-CM | POA: Diagnosis not present

## 2018-04-13 DIAGNOSIS — Z9181 History of falling: Secondary | ICD-10-CM | POA: Diagnosis not present

## 2018-04-13 DIAGNOSIS — G629 Polyneuropathy, unspecified: Secondary | ICD-10-CM | POA: Diagnosis not present

## 2018-04-13 DIAGNOSIS — I10 Essential (primary) hypertension: Secondary | ICD-10-CM | POA: Diagnosis not present

## 2018-04-13 DIAGNOSIS — Z7952 Long term (current) use of systemic steroids: Secondary | ICD-10-CM | POA: Diagnosis not present

## 2018-04-13 DIAGNOSIS — I69331 Monoplegia of upper limb following cerebral infarction affecting right dominant side: Secondary | ICD-10-CM | POA: Diagnosis not present

## 2018-04-13 DIAGNOSIS — Z87891 Personal history of nicotine dependence: Secondary | ICD-10-CM | POA: Diagnosis not present

## 2018-04-13 DIAGNOSIS — Z9049 Acquired absence of other specified parts of digestive tract: Secondary | ICD-10-CM | POA: Diagnosis not present

## 2018-04-13 DIAGNOSIS — J439 Emphysema, unspecified: Secondary | ICD-10-CM | POA: Diagnosis not present

## 2018-04-13 DIAGNOSIS — E7800 Pure hypercholesterolemia, unspecified: Secondary | ICD-10-CM | POA: Diagnosis not present

## 2018-04-13 DIAGNOSIS — H9190 Unspecified hearing loss, unspecified ear: Secondary | ICD-10-CM | POA: Diagnosis not present

## 2018-04-13 DIAGNOSIS — K635 Polyp of colon: Secondary | ICD-10-CM | POA: Diagnosis not present

## 2018-04-13 DIAGNOSIS — Z7982 Long term (current) use of aspirin: Secondary | ICD-10-CM | POA: Diagnosis not present

## 2018-04-13 DIAGNOSIS — G4761 Periodic limb movement disorder: Secondary | ICD-10-CM | POA: Diagnosis not present

## 2018-04-13 DIAGNOSIS — G4700 Insomnia, unspecified: Secondary | ICD-10-CM | POA: Diagnosis not present

## 2018-04-13 DIAGNOSIS — I700 Atherosclerosis of aorta: Secondary | ICD-10-CM | POA: Diagnosis not present

## 2018-04-13 DIAGNOSIS — J189 Pneumonia, unspecified organism: Secondary | ICD-10-CM | POA: Diagnosis not present

## 2018-04-14 DIAGNOSIS — R001 Bradycardia, unspecified: Secondary | ICD-10-CM | POA: Diagnosis not present

## 2018-04-14 DIAGNOSIS — I517 Cardiomegaly: Secondary | ICD-10-CM | POA: Diagnosis not present

## 2018-04-14 DIAGNOSIS — D649 Anemia, unspecified: Secondary | ICD-10-CM | POA: Diagnosis not present

## 2018-04-14 DIAGNOSIS — I130 Hypertensive heart and chronic kidney disease with heart failure and stage 1 through stage 4 chronic kidney disease, or unspecified chronic kidney disease: Secondary | ICD-10-CM | POA: Diagnosis not present

## 2018-04-14 DIAGNOSIS — R0603 Acute respiratory distress: Secondary | ICD-10-CM | POA: Diagnosis not present

## 2018-04-14 DIAGNOSIS — E8770 Fluid overload, unspecified: Secondary | ICD-10-CM | POA: Diagnosis not present

## 2018-04-14 DIAGNOSIS — N183 Chronic kidney disease, stage 3 (moderate): Secondary | ICD-10-CM | POA: Diagnosis not present

## 2018-04-14 DIAGNOSIS — R0989 Other specified symptoms and signs involving the circulatory and respiratory systems: Secondary | ICD-10-CM | POA: Diagnosis not present

## 2018-04-14 DIAGNOSIS — J181 Lobar pneumonia, unspecified organism: Secondary | ICD-10-CM | POA: Diagnosis not present

## 2018-04-14 DIAGNOSIS — I491 Atrial premature depolarization: Secondary | ICD-10-CM | POA: Diagnosis not present

## 2018-04-14 DIAGNOSIS — R092 Respiratory arrest: Secondary | ICD-10-CM | POA: Diagnosis not present

## 2018-04-14 DIAGNOSIS — I480 Paroxysmal atrial fibrillation: Secondary | ICD-10-CM | POA: Diagnosis not present

## 2018-04-14 DIAGNOSIS — Z95810 Presence of automatic (implantable) cardiac defibrillator: Secondary | ICD-10-CM | POA: Diagnosis not present

## 2018-04-14 DIAGNOSIS — J90 Pleural effusion, not elsewhere classified: Secondary | ICD-10-CM | POA: Diagnosis not present

## 2018-04-14 DIAGNOSIS — R55 Syncope and collapse: Secondary | ICD-10-CM | POA: Diagnosis not present

## 2018-04-14 DIAGNOSIS — K219 Gastro-esophageal reflux disease without esophagitis: Secondary | ICD-10-CM | POA: Diagnosis not present

## 2018-04-14 DIAGNOSIS — I255 Ischemic cardiomyopathy: Secondary | ICD-10-CM | POA: Diagnosis not present

## 2018-04-14 DIAGNOSIS — I083 Combined rheumatic disorders of mitral, aortic and tricuspid valves: Secondary | ICD-10-CM | POA: Diagnosis not present

## 2018-04-14 DIAGNOSIS — I5023 Acute on chronic systolic (congestive) heart failure: Secondary | ICD-10-CM | POA: Diagnosis not present

## 2018-04-14 DIAGNOSIS — I4891 Unspecified atrial fibrillation: Secondary | ICD-10-CM | POA: Diagnosis not present

## 2018-04-14 DIAGNOSIS — Z8673 Personal history of transient ischemic attack (TIA), and cerebral infarction without residual deficits: Secondary | ICD-10-CM | POA: Diagnosis not present

## 2018-04-14 DIAGNOSIS — G459 Transient cerebral ischemic attack, unspecified: Secondary | ICD-10-CM | POA: Diagnosis not present

## 2018-04-14 DIAGNOSIS — R4020 Unspecified coma: Secondary | ICD-10-CM | POA: Diagnosis not present

## 2018-04-14 DIAGNOSIS — I69331 Monoplegia of upper limb following cerebral infarction affecting right dominant side: Secondary | ICD-10-CM | POA: Diagnosis not present

## 2018-04-14 DIAGNOSIS — I442 Atrioventricular block, complete: Secondary | ICD-10-CM | POA: Diagnosis not present

## 2018-04-14 DIAGNOSIS — I5189 Other ill-defined heart diseases: Secondary | ICD-10-CM | POA: Diagnosis not present

## 2018-04-14 DIAGNOSIS — I509 Heart failure, unspecified: Secondary | ICD-10-CM | POA: Diagnosis not present

## 2018-04-14 DIAGNOSIS — I080 Rheumatic disorders of both mitral and aortic valves: Secondary | ICD-10-CM | POA: Diagnosis not present

## 2018-04-14 DIAGNOSIS — I428 Other cardiomyopathies: Secondary | ICD-10-CM | POA: Diagnosis not present

## 2018-04-14 DIAGNOSIS — Z87891 Personal history of nicotine dependence: Secondary | ICD-10-CM | POA: Diagnosis not present

## 2018-04-14 DIAGNOSIS — J810 Acute pulmonary edema: Secondary | ICD-10-CM | POA: Diagnosis not present

## 2018-04-14 DIAGNOSIS — E785 Hyperlipidemia, unspecified: Secondary | ICD-10-CM | POA: Diagnosis not present

## 2018-04-14 DIAGNOSIS — I519 Heart disease, unspecified: Secondary | ICD-10-CM | POA: Diagnosis not present

## 2018-04-14 DIAGNOSIS — R0602 Shortness of breath: Secondary | ICD-10-CM | POA: Diagnosis not present

## 2018-04-14 DIAGNOSIS — I214 Non-ST elevation (NSTEMI) myocardial infarction: Secondary | ICD-10-CM | POA: Diagnosis not present

## 2018-04-14 DIAGNOSIS — I2510 Atherosclerotic heart disease of native coronary artery without angina pectoris: Secondary | ICD-10-CM | POA: Diagnosis not present

## 2018-04-14 DIAGNOSIS — I483 Typical atrial flutter: Secondary | ICD-10-CM | POA: Diagnosis not present

## 2018-04-14 DIAGNOSIS — R918 Other nonspecific abnormal finding of lung field: Secondary | ICD-10-CM | POA: Diagnosis not present

## 2018-04-14 DIAGNOSIS — Z950 Presence of cardiac pacemaker: Secondary | ICD-10-CM | POA: Diagnosis not present

## 2018-04-14 DIAGNOSIS — M6281 Muscle weakness (generalized): Secondary | ICD-10-CM | POA: Diagnosis not present

## 2018-04-14 DIAGNOSIS — I10 Essential (primary) hypertension: Secondary | ICD-10-CM | POA: Diagnosis not present

## 2018-04-14 DIAGNOSIS — Z4682 Encounter for fitting and adjustment of non-vascular catheter: Secondary | ICD-10-CM | POA: Diagnosis not present

## 2018-04-14 DIAGNOSIS — I5022 Chronic systolic (congestive) heart failure: Secondary | ICD-10-CM | POA: Diagnosis not present

## 2018-04-14 DIAGNOSIS — J9690 Respiratory failure, unspecified, unspecified whether with hypoxia or hypercapnia: Secondary | ICD-10-CM | POA: Diagnosis not present

## 2018-04-14 DIAGNOSIS — J9601 Acute respiratory failure with hypoxia: Secondary | ICD-10-CM | POA: Diagnosis not present

## 2018-04-14 DIAGNOSIS — Z8679 Personal history of other diseases of the circulatory system: Secondary | ICD-10-CM | POA: Diagnosis not present

## 2018-04-14 DIAGNOSIS — I495 Sick sinus syndrome: Secondary | ICD-10-CM | POA: Diagnosis not present

## 2018-04-14 DIAGNOSIS — I469 Cardiac arrest, cause unspecified: Secondary | ICD-10-CM | POA: Diagnosis not present

## 2018-04-14 DIAGNOSIS — R404 Transient alteration of awareness: Secondary | ICD-10-CM | POA: Diagnosis not present

## 2018-04-14 DIAGNOSIS — I4892 Unspecified atrial flutter: Secondary | ICD-10-CM | POA: Diagnosis not present

## 2018-04-14 DIAGNOSIS — J189 Pneumonia, unspecified organism: Secondary | ICD-10-CM | POA: Diagnosis not present

## 2018-04-14 DIAGNOSIS — I484 Atypical atrial flutter: Secondary | ICD-10-CM | POA: Diagnosis not present

## 2018-04-14 DIAGNOSIS — I2583 Coronary atherosclerosis due to lipid rich plaque: Secondary | ICD-10-CM | POA: Diagnosis not present

## 2018-04-14 DIAGNOSIS — M1990 Unspecified osteoarthritis, unspecified site: Secondary | ICD-10-CM | POA: Diagnosis not present

## 2018-04-15 DIAGNOSIS — I517 Cardiomegaly: Secondary | ICD-10-CM | POA: Diagnosis not present

## 2018-04-15 DIAGNOSIS — J9601 Acute respiratory failure with hypoxia: Secondary | ICD-10-CM | POA: Diagnosis not present

## 2018-04-15 DIAGNOSIS — I5189 Other ill-defined heart diseases: Secondary | ICD-10-CM | POA: Diagnosis not present

## 2018-04-15 DIAGNOSIS — R918 Other nonspecific abnormal finding of lung field: Secondary | ICD-10-CM | POA: Diagnosis not present

## 2018-04-15 DIAGNOSIS — I519 Heart disease, unspecified: Secondary | ICD-10-CM | POA: Diagnosis not present

## 2018-04-15 DIAGNOSIS — I083 Combined rheumatic disorders of mitral, aortic and tricuspid valves: Secondary | ICD-10-CM | POA: Diagnosis not present

## 2018-04-17 DIAGNOSIS — R0989 Other specified symptoms and signs involving the circulatory and respiratory systems: Secondary | ICD-10-CM | POA: Diagnosis not present

## 2018-04-19 DIAGNOSIS — I69331 Monoplegia of upper limb following cerebral infarction affecting right dominant side: Secondary | ICD-10-CM | POA: Diagnosis not present

## 2018-04-23 DIAGNOSIS — R918 Other nonspecific abnormal finding of lung field: Secondary | ICD-10-CM | POA: Diagnosis not present

## 2018-04-23 DIAGNOSIS — E785 Hyperlipidemia, unspecified: Secondary | ICD-10-CM | POA: Diagnosis not present

## 2018-04-23 DIAGNOSIS — I5022 Chronic systolic (congestive) heart failure: Secondary | ICD-10-CM | POA: Diagnosis not present

## 2018-04-23 DIAGNOSIS — I2510 Atherosclerotic heart disease of native coronary artery without angina pectoris: Secondary | ICD-10-CM | POA: Diagnosis not present

## 2018-04-23 DIAGNOSIS — I255 Ischemic cardiomyopathy: Secondary | ICD-10-CM | POA: Diagnosis not present

## 2018-04-23 DIAGNOSIS — Z950 Presence of cardiac pacemaker: Secondary | ICD-10-CM | POA: Diagnosis not present

## 2018-04-23 DIAGNOSIS — R7989 Other specified abnormal findings of blood chemistry: Secondary | ICD-10-CM | POA: Diagnosis not present

## 2018-04-23 DIAGNOSIS — I480 Paroxysmal atrial fibrillation: Secondary | ICD-10-CM | POA: Diagnosis not present

## 2018-04-23 DIAGNOSIS — J189 Pneumonia, unspecified organism: Secondary | ICD-10-CM | POA: Diagnosis not present

## 2018-04-23 DIAGNOSIS — M1990 Unspecified osteoarthritis, unspecified site: Secondary | ICD-10-CM | POA: Diagnosis not present

## 2018-04-23 DIAGNOSIS — I214 Non-ST elevation (NSTEMI) myocardial infarction: Secondary | ICD-10-CM | POA: Diagnosis not present

## 2018-04-23 DIAGNOSIS — R531 Weakness: Secondary | ICD-10-CM | POA: Diagnosis not present

## 2018-04-23 DIAGNOSIS — J449 Chronic obstructive pulmonary disease, unspecified: Secondary | ICD-10-CM | POA: Diagnosis not present

## 2018-04-23 DIAGNOSIS — A419 Sepsis, unspecified organism: Secondary | ICD-10-CM | POA: Diagnosis not present

## 2018-04-23 DIAGNOSIS — R001 Bradycardia, unspecified: Secondary | ICD-10-CM | POA: Diagnosis not present

## 2018-04-23 DIAGNOSIS — R55 Syncope and collapse: Secondary | ICD-10-CM | POA: Diagnosis not present

## 2018-04-23 DIAGNOSIS — I5023 Acute on chronic systolic (congestive) heart failure: Secondary | ICD-10-CM | POA: Diagnosis not present

## 2018-04-23 DIAGNOSIS — J9690 Respiratory failure, unspecified, unspecified whether with hypoxia or hypercapnia: Secondary | ICD-10-CM | POA: Diagnosis not present

## 2018-04-23 DIAGNOSIS — K219 Gastro-esophageal reflux disease without esophagitis: Secondary | ICD-10-CM | POA: Diagnosis not present

## 2018-04-23 DIAGNOSIS — I442 Atrioventricular block, complete: Secondary | ICD-10-CM | POA: Diagnosis not present

## 2018-04-23 DIAGNOSIS — G9009 Other idiopathic peripheral autonomic neuropathy: Secondary | ICD-10-CM | POA: Diagnosis not present

## 2018-04-23 DIAGNOSIS — G4700 Insomnia, unspecified: Secondary | ICD-10-CM | POA: Diagnosis not present

## 2018-04-23 DIAGNOSIS — J9601 Acute respiratory failure with hypoxia: Secondary | ICD-10-CM | POA: Diagnosis not present

## 2018-04-23 DIAGNOSIS — I495 Sick sinus syndrome: Secondary | ICD-10-CM | POA: Diagnosis not present

## 2018-04-23 DIAGNOSIS — I10 Essential (primary) hypertension: Secondary | ICD-10-CM | POA: Diagnosis not present

## 2018-04-23 DIAGNOSIS — M6281 Muscle weakness (generalized): Secondary | ICD-10-CM | POA: Diagnosis not present

## 2018-04-23 DIAGNOSIS — G459 Transient cerebral ischemic attack, unspecified: Secondary | ICD-10-CM | POA: Diagnosis not present

## 2018-04-24 DIAGNOSIS — G9009 Other idiopathic peripheral autonomic neuropathy: Secondary | ICD-10-CM | POA: Diagnosis not present

## 2018-04-24 DIAGNOSIS — R531 Weakness: Secondary | ICD-10-CM | POA: Diagnosis not present

## 2018-04-24 DIAGNOSIS — I2510 Atherosclerotic heart disease of native coronary artery without angina pectoris: Secondary | ICD-10-CM | POA: Diagnosis not present

## 2018-04-24 DIAGNOSIS — I5022 Chronic systolic (congestive) heart failure: Secondary | ICD-10-CM | POA: Diagnosis not present

## 2018-04-24 DIAGNOSIS — J449 Chronic obstructive pulmonary disease, unspecified: Secondary | ICD-10-CM | POA: Diagnosis not present

## 2018-04-26 DIAGNOSIS — R0689 Other abnormalities of breathing: Secondary | ICD-10-CM | POA: Diagnosis not present

## 2018-04-26 DIAGNOSIS — Z743 Need for continuous supervision: Secondary | ICD-10-CM | POA: Diagnosis not present

## 2018-04-26 DIAGNOSIS — R7989 Other specified abnormal findings of blood chemistry: Secondary | ICD-10-CM | POA: Diagnosis not present

## 2018-04-26 DIAGNOSIS — I255 Ischemic cardiomyopathy: Secondary | ICD-10-CM | POA: Diagnosis not present

## 2018-04-26 DIAGNOSIS — M1990 Unspecified osteoarthritis, unspecified site: Secondary | ICD-10-CM | POA: Diagnosis not present

## 2018-04-26 DIAGNOSIS — Z7982 Long term (current) use of aspirin: Secondary | ICD-10-CM | POA: Diagnosis not present

## 2018-04-26 DIAGNOSIS — R531 Weakness: Secondary | ICD-10-CM | POA: Diagnosis not present

## 2018-04-26 DIAGNOSIS — G459 Transient cerebral ischemic attack, unspecified: Secondary | ICD-10-CM | POA: Diagnosis not present

## 2018-04-26 DIAGNOSIS — R0902 Hypoxemia: Secondary | ICD-10-CM | POA: Diagnosis not present

## 2018-04-26 DIAGNOSIS — D649 Anemia, unspecified: Secondary | ICD-10-CM | POA: Diagnosis not present

## 2018-04-26 DIAGNOSIS — A419 Sepsis, unspecified organism: Secondary | ICD-10-CM | POA: Diagnosis not present

## 2018-04-26 DIAGNOSIS — R262 Difficulty in walking, not elsewhere classified: Secondary | ICD-10-CM | POA: Diagnosis not present

## 2018-04-26 DIAGNOSIS — R0989 Other specified symptoms and signs involving the circulatory and respiratory systems: Secondary | ICD-10-CM | POA: Diagnosis not present

## 2018-04-26 DIAGNOSIS — I5033 Acute on chronic diastolic (congestive) heart failure: Secondary | ICD-10-CM | POA: Diagnosis not present

## 2018-04-26 DIAGNOSIS — R918 Other nonspecific abnormal finding of lung field: Secondary | ICD-10-CM | POA: Diagnosis not present

## 2018-04-26 DIAGNOSIS — I5023 Acute on chronic systolic (congestive) heart failure: Secondary | ICD-10-CM | POA: Diagnosis not present

## 2018-04-26 DIAGNOSIS — R069 Unspecified abnormalities of breathing: Secondary | ICD-10-CM | POA: Diagnosis not present

## 2018-04-26 DIAGNOSIS — R5381 Other malaise: Secondary | ICD-10-CM | POA: Diagnosis not present

## 2018-04-26 DIAGNOSIS — I495 Sick sinus syndrome: Secondary | ICD-10-CM | POA: Diagnosis not present

## 2018-04-26 DIAGNOSIS — Z7952 Long term (current) use of systemic steroids: Secondary | ICD-10-CM | POA: Diagnosis not present

## 2018-04-26 DIAGNOSIS — R001 Bradycardia, unspecified: Secondary | ICD-10-CM | POA: Diagnosis not present

## 2018-04-26 DIAGNOSIS — J189 Pneumonia, unspecified organism: Secondary | ICD-10-CM | POA: Diagnosis not present

## 2018-04-26 DIAGNOSIS — I252 Old myocardial infarction: Secondary | ICD-10-CM | POA: Diagnosis not present

## 2018-04-26 DIAGNOSIS — Z950 Presence of cardiac pacemaker: Secondary | ICD-10-CM | POA: Diagnosis not present

## 2018-04-26 DIAGNOSIS — K219 Gastro-esophageal reflux disease without esophagitis: Secondary | ICD-10-CM | POA: Diagnosis not present

## 2018-04-26 DIAGNOSIS — D72829 Elevated white blood cell count, unspecified: Secondary | ICD-10-CM | POA: Diagnosis not present

## 2018-04-26 DIAGNOSIS — G4700 Insomnia, unspecified: Secondary | ICD-10-CM | POA: Diagnosis not present

## 2018-04-26 DIAGNOSIS — R0603 Acute respiratory distress: Secondary | ICD-10-CM | POA: Diagnosis not present

## 2018-04-26 DIAGNOSIS — R748 Abnormal levels of other serum enzymes: Secondary | ICD-10-CM | POA: Diagnosis not present

## 2018-04-26 DIAGNOSIS — E7800 Pure hypercholesterolemia, unspecified: Secondary | ICD-10-CM | POA: Diagnosis not present

## 2018-04-26 DIAGNOSIS — I110 Hypertensive heart disease with heart failure: Secondary | ICD-10-CM | POA: Diagnosis not present

## 2018-04-26 DIAGNOSIS — R0602 Shortness of breath: Secondary | ICD-10-CM | POA: Diagnosis not present

## 2018-04-26 DIAGNOSIS — E785 Hyperlipidemia, unspecified: Secondary | ICD-10-CM | POA: Diagnosis not present

## 2018-04-26 DIAGNOSIS — J9601 Acute respiratory failure with hypoxia: Secondary | ICD-10-CM | POA: Diagnosis not present

## 2018-04-26 DIAGNOSIS — I480 Paroxysmal atrial fibrillation: Secondary | ICD-10-CM | POA: Diagnosis not present

## 2018-04-26 DIAGNOSIS — Z79899 Other long term (current) drug therapy: Secondary | ICD-10-CM | POA: Diagnosis not present

## 2018-04-26 DIAGNOSIS — I442 Atrioventricular block, complete: Secondary | ICD-10-CM | POA: Diagnosis not present

## 2018-04-26 DIAGNOSIS — I2510 Atherosclerotic heart disease of native coronary artery without angina pectoris: Secondary | ICD-10-CM | POA: Diagnosis not present

## 2018-04-26 DIAGNOSIS — I10 Essential (primary) hypertension: Secondary | ICD-10-CM | POA: Diagnosis not present

## 2018-04-26 DIAGNOSIS — G2581 Restless legs syndrome: Secondary | ICD-10-CM | POA: Diagnosis not present

## 2018-04-28 DIAGNOSIS — R262 Difficulty in walking, not elsewhere classified: Secondary | ICD-10-CM | POA: Diagnosis not present

## 2018-05-05 DIAGNOSIS — R262 Difficulty in walking, not elsewhere classified: Secondary | ICD-10-CM | POA: Diagnosis not present

## 2018-05-05 DIAGNOSIS — J189 Pneumonia, unspecified organism: Secondary | ICD-10-CM | POA: Diagnosis not present

## 2018-05-05 DIAGNOSIS — I442 Atrioventricular block, complete: Secondary | ICD-10-CM | POA: Diagnosis not present

## 2018-05-05 DIAGNOSIS — I252 Old myocardial infarction: Secondary | ICD-10-CM | POA: Diagnosis not present

## 2018-05-05 DIAGNOSIS — G4700 Insomnia, unspecified: Secondary | ICD-10-CM | POA: Diagnosis not present

## 2018-05-05 DIAGNOSIS — Z743 Need for continuous supervision: Secondary | ICD-10-CM | POA: Diagnosis not present

## 2018-05-05 DIAGNOSIS — G2581 Restless legs syndrome: Secondary | ICD-10-CM | POA: Diagnosis not present

## 2018-05-05 DIAGNOSIS — E785 Hyperlipidemia, unspecified: Secondary | ICD-10-CM | POA: Diagnosis not present

## 2018-05-05 DIAGNOSIS — M1990 Unspecified osteoarthritis, unspecified site: Secondary | ICD-10-CM | POA: Diagnosis not present

## 2018-05-05 DIAGNOSIS — A419 Sepsis, unspecified organism: Secondary | ICD-10-CM | POA: Diagnosis not present

## 2018-05-05 DIAGNOSIS — R0902 Hypoxemia: Secondary | ICD-10-CM | POA: Diagnosis not present

## 2018-05-05 DIAGNOSIS — R5381 Other malaise: Secondary | ICD-10-CM | POA: Diagnosis not present

## 2018-05-05 DIAGNOSIS — R531 Weakness: Secondary | ICD-10-CM | POA: Diagnosis not present

## 2018-05-05 DIAGNOSIS — R7989 Other specified abnormal findings of blood chemistry: Secondary | ICD-10-CM | POA: Diagnosis not present

## 2018-05-05 DIAGNOSIS — R069 Unspecified abnormalities of breathing: Secondary | ICD-10-CM | POA: Diagnosis not present

## 2018-05-05 DIAGNOSIS — R918 Other nonspecific abnormal finding of lung field: Secondary | ICD-10-CM | POA: Diagnosis not present

## 2018-05-05 DIAGNOSIS — Z79899 Other long term (current) drug therapy: Secondary | ICD-10-CM | POA: Diagnosis not present

## 2018-05-05 DIAGNOSIS — K219 Gastro-esophageal reflux disease without esophagitis: Secondary | ICD-10-CM | POA: Diagnosis not present

## 2018-05-05 DIAGNOSIS — R001 Bradycardia, unspecified: Secondary | ICD-10-CM | POA: Diagnosis not present

## 2018-05-05 DIAGNOSIS — J9601 Acute respiratory failure with hypoxia: Secondary | ICD-10-CM | POA: Diagnosis not present

## 2018-05-05 DIAGNOSIS — D72829 Elevated white blood cell count, unspecified: Secondary | ICD-10-CM

## 2018-05-05 DIAGNOSIS — G459 Transient cerebral ischemic attack, unspecified: Secondary | ICD-10-CM | POA: Diagnosis not present

## 2018-05-05 DIAGNOSIS — I495 Sick sinus syndrome: Secondary | ICD-10-CM | POA: Diagnosis not present

## 2018-05-05 DIAGNOSIS — I110 Hypertensive heart disease with heart failure: Secondary | ICD-10-CM | POA: Diagnosis not present

## 2018-05-05 DIAGNOSIS — Z7952 Long term (current) use of systemic steroids: Secondary | ICD-10-CM | POA: Diagnosis not present

## 2018-05-05 DIAGNOSIS — Z7982 Long term (current) use of aspirin: Secondary | ICD-10-CM | POA: Diagnosis not present

## 2018-05-05 DIAGNOSIS — R0989 Other specified symptoms and signs involving the circulatory and respiratory systems: Secondary | ICD-10-CM | POA: Diagnosis not present

## 2018-05-05 DIAGNOSIS — R748 Abnormal levels of other serum enzymes: Secondary | ICD-10-CM | POA: Diagnosis not present

## 2018-05-05 DIAGNOSIS — I10 Essential (primary) hypertension: Secondary | ICD-10-CM | POA: Diagnosis not present

## 2018-05-05 DIAGNOSIS — E7800 Pure hypercholesterolemia, unspecified: Secondary | ICD-10-CM | POA: Diagnosis not present

## 2018-05-05 DIAGNOSIS — Z950 Presence of cardiac pacemaker: Secondary | ICD-10-CM | POA: Diagnosis not present

## 2018-05-05 DIAGNOSIS — I5033 Acute on chronic diastolic (congestive) heart failure: Secondary | ICD-10-CM | POA: Diagnosis not present

## 2018-05-05 DIAGNOSIS — R0603 Acute respiratory distress: Secondary | ICD-10-CM | POA: Diagnosis not present

## 2018-05-05 DIAGNOSIS — R0602 Shortness of breath: Secondary | ICD-10-CM | POA: Diagnosis not present

## 2018-05-05 DIAGNOSIS — I480 Paroxysmal atrial fibrillation: Secondary | ICD-10-CM | POA: Diagnosis not present

## 2018-05-05 DIAGNOSIS — R0689 Other abnormalities of breathing: Secondary | ICD-10-CM | POA: Diagnosis not present

## 2018-05-05 DIAGNOSIS — I5023 Acute on chronic systolic (congestive) heart failure: Secondary | ICD-10-CM | POA: Diagnosis not present

## 2018-05-20 DIAGNOSIS — Z87891 Personal history of nicotine dependence: Secondary | ICD-10-CM | POA: Diagnosis not present

## 2018-05-20 DIAGNOSIS — I495 Sick sinus syndrome: Secondary | ICD-10-CM | POA: Diagnosis not present

## 2018-05-20 DIAGNOSIS — Z7952 Long term (current) use of systemic steroids: Secondary | ICD-10-CM | POA: Diagnosis not present

## 2018-05-20 DIAGNOSIS — G629 Polyneuropathy, unspecified: Secondary | ICD-10-CM | POA: Diagnosis not present

## 2018-05-20 DIAGNOSIS — I130 Hypertensive heart and chronic kidney disease with heart failure and stage 1 through stage 4 chronic kidney disease, or unspecified chronic kidney disease: Secondary | ICD-10-CM | POA: Diagnosis not present

## 2018-05-20 DIAGNOSIS — Z9181 History of falling: Secondary | ICD-10-CM | POA: Diagnosis not present

## 2018-05-20 DIAGNOSIS — Z950 Presence of cardiac pacemaker: Secondary | ICD-10-CM | POA: Diagnosis not present

## 2018-05-20 DIAGNOSIS — J439 Emphysema, unspecified: Secondary | ICD-10-CM | POA: Diagnosis not present

## 2018-05-20 DIAGNOSIS — G4700 Insomnia, unspecified: Secondary | ICD-10-CM | POA: Diagnosis not present

## 2018-05-20 DIAGNOSIS — H9190 Unspecified hearing loss, unspecified ear: Secondary | ICD-10-CM | POA: Diagnosis not present

## 2018-05-20 DIAGNOSIS — I5020 Unspecified systolic (congestive) heart failure: Secondary | ICD-10-CM | POA: Diagnosis not present

## 2018-05-20 DIAGNOSIS — E7800 Pure hypercholesterolemia, unspecified: Secondary | ICD-10-CM | POA: Diagnosis not present

## 2018-05-20 DIAGNOSIS — I69331 Monoplegia of upper limb following cerebral infarction affecting right dominant side: Secondary | ICD-10-CM | POA: Diagnosis not present

## 2018-05-20 DIAGNOSIS — I700 Atherosclerosis of aorta: Secondary | ICD-10-CM | POA: Diagnosis not present

## 2018-05-20 DIAGNOSIS — N189 Chronic kidney disease, unspecified: Secondary | ICD-10-CM | POA: Diagnosis not present

## 2018-05-20 DIAGNOSIS — M170 Bilateral primary osteoarthritis of knee: Secondary | ICD-10-CM | POA: Diagnosis not present

## 2018-05-20 DIAGNOSIS — K635 Polyp of colon: Secondary | ICD-10-CM | POA: Diagnosis not present

## 2018-05-20 DIAGNOSIS — Z7951 Long term (current) use of inhaled steroids: Secondary | ICD-10-CM | POA: Diagnosis not present

## 2018-05-20 DIAGNOSIS — Z7982 Long term (current) use of aspirin: Secondary | ICD-10-CM | POA: Diagnosis not present

## 2018-05-20 DIAGNOSIS — M810 Age-related osteoporosis without current pathological fracture: Secondary | ICD-10-CM | POA: Diagnosis not present

## 2018-05-20 DIAGNOSIS — G4761 Periodic limb movement disorder: Secondary | ICD-10-CM | POA: Diagnosis not present

## 2018-05-21 DIAGNOSIS — Z950 Presence of cardiac pacemaker: Secondary | ICD-10-CM | POA: Diagnosis not present

## 2018-05-21 DIAGNOSIS — G4761 Periodic limb movement disorder: Secondary | ICD-10-CM | POA: Diagnosis not present

## 2018-05-21 DIAGNOSIS — N189 Chronic kidney disease, unspecified: Secondary | ICD-10-CM | POA: Diagnosis not present

## 2018-05-21 DIAGNOSIS — I69331 Monoplegia of upper limb following cerebral infarction affecting right dominant side: Secondary | ICD-10-CM | POA: Diagnosis not present

## 2018-05-21 DIAGNOSIS — I700 Atherosclerosis of aorta: Secondary | ICD-10-CM | POA: Diagnosis not present

## 2018-05-21 DIAGNOSIS — Z87891 Personal history of nicotine dependence: Secondary | ICD-10-CM | POA: Diagnosis not present

## 2018-05-21 DIAGNOSIS — K635 Polyp of colon: Secondary | ICD-10-CM | POA: Diagnosis not present

## 2018-05-21 DIAGNOSIS — Z7952 Long term (current) use of systemic steroids: Secondary | ICD-10-CM | POA: Diagnosis not present

## 2018-05-21 DIAGNOSIS — M170 Bilateral primary osteoarthritis of knee: Secondary | ICD-10-CM | POA: Diagnosis not present

## 2018-05-21 DIAGNOSIS — J439 Emphysema, unspecified: Secondary | ICD-10-CM | POA: Diagnosis not present

## 2018-05-21 DIAGNOSIS — Z7982 Long term (current) use of aspirin: Secondary | ICD-10-CM | POA: Diagnosis not present

## 2018-05-21 DIAGNOSIS — M810 Age-related osteoporosis without current pathological fracture: Secondary | ICD-10-CM | POA: Diagnosis not present

## 2018-05-21 DIAGNOSIS — I5020 Unspecified systolic (congestive) heart failure: Secondary | ICD-10-CM | POA: Diagnosis not present

## 2018-05-21 DIAGNOSIS — I495 Sick sinus syndrome: Secondary | ICD-10-CM | POA: Diagnosis not present

## 2018-05-21 DIAGNOSIS — I130 Hypertensive heart and chronic kidney disease with heart failure and stage 1 through stage 4 chronic kidney disease, or unspecified chronic kidney disease: Secondary | ICD-10-CM | POA: Diagnosis not present

## 2018-05-21 DIAGNOSIS — Z7951 Long term (current) use of inhaled steroids: Secondary | ICD-10-CM | POA: Diagnosis not present

## 2018-05-21 DIAGNOSIS — H9190 Unspecified hearing loss, unspecified ear: Secondary | ICD-10-CM | POA: Diagnosis not present

## 2018-05-21 DIAGNOSIS — G4700 Insomnia, unspecified: Secondary | ICD-10-CM | POA: Diagnosis not present

## 2018-05-21 DIAGNOSIS — Z9181 History of falling: Secondary | ICD-10-CM | POA: Diagnosis not present

## 2018-05-21 DIAGNOSIS — G629 Polyneuropathy, unspecified: Secondary | ICD-10-CM | POA: Diagnosis not present

## 2018-05-21 DIAGNOSIS — E7800 Pure hypercholesterolemia, unspecified: Secondary | ICD-10-CM | POA: Diagnosis not present

## 2018-05-24 DIAGNOSIS — Z950 Presence of cardiac pacemaker: Secondary | ICD-10-CM | POA: Diagnosis not present

## 2018-05-24 DIAGNOSIS — E7800 Pure hypercholesterolemia, unspecified: Secondary | ICD-10-CM | POA: Diagnosis not present

## 2018-05-24 DIAGNOSIS — I130 Hypertensive heart and chronic kidney disease with heart failure and stage 1 through stage 4 chronic kidney disease, or unspecified chronic kidney disease: Secondary | ICD-10-CM | POA: Diagnosis not present

## 2018-05-24 DIAGNOSIS — M170 Bilateral primary osteoarthritis of knee: Secondary | ICD-10-CM | POA: Diagnosis not present

## 2018-05-24 DIAGNOSIS — Z7951 Long term (current) use of inhaled steroids: Secondary | ICD-10-CM | POA: Diagnosis not present

## 2018-05-24 DIAGNOSIS — Z87891 Personal history of nicotine dependence: Secondary | ICD-10-CM | POA: Diagnosis not present

## 2018-05-24 DIAGNOSIS — Z7982 Long term (current) use of aspirin: Secondary | ICD-10-CM | POA: Diagnosis not present

## 2018-05-24 DIAGNOSIS — K635 Polyp of colon: Secondary | ICD-10-CM | POA: Diagnosis not present

## 2018-05-24 DIAGNOSIS — Z9181 History of falling: Secondary | ICD-10-CM | POA: Diagnosis not present

## 2018-05-24 DIAGNOSIS — I495 Sick sinus syndrome: Secondary | ICD-10-CM | POA: Diagnosis not present

## 2018-05-24 DIAGNOSIS — N189 Chronic kidney disease, unspecified: Secondary | ICD-10-CM | POA: Diagnosis not present

## 2018-05-24 DIAGNOSIS — J439 Emphysema, unspecified: Secondary | ICD-10-CM | POA: Diagnosis not present

## 2018-05-24 DIAGNOSIS — G4761 Periodic limb movement disorder: Secondary | ICD-10-CM | POA: Diagnosis not present

## 2018-05-24 DIAGNOSIS — G629 Polyneuropathy, unspecified: Secondary | ICD-10-CM | POA: Diagnosis not present

## 2018-05-24 DIAGNOSIS — M810 Age-related osteoporosis without current pathological fracture: Secondary | ICD-10-CM | POA: Diagnosis not present

## 2018-05-24 DIAGNOSIS — I700 Atherosclerosis of aorta: Secondary | ICD-10-CM | POA: Diagnosis not present

## 2018-05-24 DIAGNOSIS — H9190 Unspecified hearing loss, unspecified ear: Secondary | ICD-10-CM | POA: Diagnosis not present

## 2018-05-24 DIAGNOSIS — G4700 Insomnia, unspecified: Secondary | ICD-10-CM | POA: Diagnosis not present

## 2018-05-24 DIAGNOSIS — I5020 Unspecified systolic (congestive) heart failure: Secondary | ICD-10-CM | POA: Diagnosis not present

## 2018-05-24 DIAGNOSIS — I69331 Monoplegia of upper limb following cerebral infarction affecting right dominant side: Secondary | ICD-10-CM | POA: Diagnosis not present

## 2018-05-24 DIAGNOSIS — Z7952 Long term (current) use of systemic steroids: Secondary | ICD-10-CM | POA: Diagnosis not present

## 2018-05-25 DIAGNOSIS — Z7982 Long term (current) use of aspirin: Secondary | ICD-10-CM | POA: Diagnosis not present

## 2018-05-25 DIAGNOSIS — H9190 Unspecified hearing loss, unspecified ear: Secondary | ICD-10-CM | POA: Diagnosis not present

## 2018-05-25 DIAGNOSIS — I69331 Monoplegia of upper limb following cerebral infarction affecting right dominant side: Secondary | ICD-10-CM | POA: Diagnosis not present

## 2018-05-25 DIAGNOSIS — J439 Emphysema, unspecified: Secondary | ICD-10-CM | POA: Diagnosis not present

## 2018-05-25 DIAGNOSIS — I700 Atherosclerosis of aorta: Secondary | ICD-10-CM | POA: Diagnosis not present

## 2018-05-25 DIAGNOSIS — M810 Age-related osteoporosis without current pathological fracture: Secondary | ICD-10-CM | POA: Diagnosis not present

## 2018-05-25 DIAGNOSIS — G629 Polyneuropathy, unspecified: Secondary | ICD-10-CM | POA: Diagnosis not present

## 2018-05-25 DIAGNOSIS — M170 Bilateral primary osteoarthritis of knee: Secondary | ICD-10-CM | POA: Diagnosis not present

## 2018-05-25 DIAGNOSIS — E7800 Pure hypercholesterolemia, unspecified: Secondary | ICD-10-CM | POA: Diagnosis not present

## 2018-05-25 DIAGNOSIS — G4761 Periodic limb movement disorder: Secondary | ICD-10-CM | POA: Diagnosis not present

## 2018-05-25 DIAGNOSIS — Z950 Presence of cardiac pacemaker: Secondary | ICD-10-CM | POA: Diagnosis not present

## 2018-05-25 DIAGNOSIS — N189 Chronic kidney disease, unspecified: Secondary | ICD-10-CM | POA: Diagnosis not present

## 2018-05-25 DIAGNOSIS — I442 Atrioventricular block, complete: Secondary | ICD-10-CM | POA: Diagnosis not present

## 2018-05-25 DIAGNOSIS — I5020 Unspecified systolic (congestive) heart failure: Secondary | ICD-10-CM | POA: Diagnosis not present

## 2018-05-25 DIAGNOSIS — I130 Hypertensive heart and chronic kidney disease with heart failure and stage 1 through stage 4 chronic kidney disease, or unspecified chronic kidney disease: Secondary | ICD-10-CM | POA: Diagnosis not present

## 2018-05-25 DIAGNOSIS — Z87891 Personal history of nicotine dependence: Secondary | ICD-10-CM | POA: Diagnosis not present

## 2018-05-25 DIAGNOSIS — Z7952 Long term (current) use of systemic steroids: Secondary | ICD-10-CM | POA: Diagnosis not present

## 2018-05-25 DIAGNOSIS — I495 Sick sinus syndrome: Secondary | ICD-10-CM | POA: Diagnosis not present

## 2018-05-25 DIAGNOSIS — K635 Polyp of colon: Secondary | ICD-10-CM | POA: Diagnosis not present

## 2018-05-25 DIAGNOSIS — G4700 Insomnia, unspecified: Secondary | ICD-10-CM | POA: Diagnosis not present

## 2018-05-25 DIAGNOSIS — Z9181 History of falling: Secondary | ICD-10-CM | POA: Diagnosis not present

## 2018-05-25 DIAGNOSIS — Z7951 Long term (current) use of inhaled steroids: Secondary | ICD-10-CM | POA: Diagnosis not present

## 2018-05-26 DIAGNOSIS — M810 Age-related osteoporosis without current pathological fracture: Secondary | ICD-10-CM | POA: Diagnosis not present

## 2018-05-26 DIAGNOSIS — I130 Hypertensive heart and chronic kidney disease with heart failure and stage 1 through stage 4 chronic kidney disease, or unspecified chronic kidney disease: Secondary | ICD-10-CM | POA: Diagnosis not present

## 2018-05-26 DIAGNOSIS — I509 Heart failure, unspecified: Secondary | ICD-10-CM | POA: Diagnosis not present

## 2018-05-26 DIAGNOSIS — H9190 Unspecified hearing loss, unspecified ear: Secondary | ICD-10-CM | POA: Diagnosis not present

## 2018-05-26 DIAGNOSIS — M170 Bilateral primary osteoarthritis of knee: Secondary | ICD-10-CM | POA: Diagnosis not present

## 2018-05-26 DIAGNOSIS — Z7982 Long term (current) use of aspirin: Secondary | ICD-10-CM | POA: Diagnosis not present

## 2018-05-26 DIAGNOSIS — Z9181 History of falling: Secondary | ICD-10-CM | POA: Diagnosis not present

## 2018-05-26 DIAGNOSIS — G4700 Insomnia, unspecified: Secondary | ICD-10-CM | POA: Diagnosis not present

## 2018-05-26 DIAGNOSIS — I69331 Monoplegia of upper limb following cerebral infarction affecting right dominant side: Secondary | ICD-10-CM | POA: Diagnosis not present

## 2018-05-26 DIAGNOSIS — Z950 Presence of cardiac pacemaker: Secondary | ICD-10-CM | POA: Diagnosis not present

## 2018-05-26 DIAGNOSIS — J439 Emphysema, unspecified: Secondary | ICD-10-CM | POA: Diagnosis not present

## 2018-05-26 DIAGNOSIS — K635 Polyp of colon: Secondary | ICD-10-CM | POA: Diagnosis not present

## 2018-05-26 DIAGNOSIS — Z7952 Long term (current) use of systemic steroids: Secondary | ICD-10-CM | POA: Diagnosis not present

## 2018-05-26 DIAGNOSIS — I495 Sick sinus syndrome: Secondary | ICD-10-CM | POA: Diagnosis not present

## 2018-05-26 DIAGNOSIS — Z7951 Long term (current) use of inhaled steroids: Secondary | ICD-10-CM | POA: Diagnosis not present

## 2018-05-26 DIAGNOSIS — E7800 Pure hypercholesterolemia, unspecified: Secondary | ICD-10-CM | POA: Diagnosis not present

## 2018-05-26 DIAGNOSIS — N189 Chronic kidney disease, unspecified: Secondary | ICD-10-CM | POA: Diagnosis not present

## 2018-05-26 DIAGNOSIS — G629 Polyneuropathy, unspecified: Secondary | ICD-10-CM | POA: Diagnosis not present

## 2018-05-26 DIAGNOSIS — G4761 Periodic limb movement disorder: Secondary | ICD-10-CM | POA: Diagnosis not present

## 2018-05-26 DIAGNOSIS — I10 Essential (primary) hypertension: Secondary | ICD-10-CM | POA: Diagnosis not present

## 2018-05-26 DIAGNOSIS — I5020 Unspecified systolic (congestive) heart failure: Secondary | ICD-10-CM | POA: Diagnosis not present

## 2018-05-26 DIAGNOSIS — Z87891 Personal history of nicotine dependence: Secondary | ICD-10-CM | POA: Diagnosis not present

## 2018-05-26 DIAGNOSIS — I700 Atherosclerosis of aorta: Secondary | ICD-10-CM | POA: Diagnosis not present

## 2018-05-27 DIAGNOSIS — M170 Bilateral primary osteoarthritis of knee: Secondary | ICD-10-CM | POA: Diagnosis not present

## 2018-05-27 DIAGNOSIS — Z7982 Long term (current) use of aspirin: Secondary | ICD-10-CM | POA: Diagnosis not present

## 2018-05-27 DIAGNOSIS — Z950 Presence of cardiac pacemaker: Secondary | ICD-10-CM | POA: Diagnosis not present

## 2018-05-27 DIAGNOSIS — Z7952 Long term (current) use of systemic steroids: Secondary | ICD-10-CM | POA: Diagnosis not present

## 2018-05-27 DIAGNOSIS — K635 Polyp of colon: Secondary | ICD-10-CM | POA: Diagnosis not present

## 2018-05-27 DIAGNOSIS — I700 Atherosclerosis of aorta: Secondary | ICD-10-CM | POA: Diagnosis not present

## 2018-05-27 DIAGNOSIS — H9190 Unspecified hearing loss, unspecified ear: Secondary | ICD-10-CM | POA: Diagnosis not present

## 2018-05-27 DIAGNOSIS — Z87891 Personal history of nicotine dependence: Secondary | ICD-10-CM | POA: Diagnosis not present

## 2018-05-27 DIAGNOSIS — E7800 Pure hypercholesterolemia, unspecified: Secondary | ICD-10-CM | POA: Diagnosis not present

## 2018-05-27 DIAGNOSIS — J439 Emphysema, unspecified: Secondary | ICD-10-CM | POA: Diagnosis not present

## 2018-05-27 DIAGNOSIS — G4761 Periodic limb movement disorder: Secondary | ICD-10-CM | POA: Diagnosis not present

## 2018-05-27 DIAGNOSIS — G4700 Insomnia, unspecified: Secondary | ICD-10-CM | POA: Diagnosis not present

## 2018-05-27 DIAGNOSIS — N189 Chronic kidney disease, unspecified: Secondary | ICD-10-CM | POA: Diagnosis not present

## 2018-05-27 DIAGNOSIS — I130 Hypertensive heart and chronic kidney disease with heart failure and stage 1 through stage 4 chronic kidney disease, or unspecified chronic kidney disease: Secondary | ICD-10-CM | POA: Diagnosis not present

## 2018-05-27 DIAGNOSIS — Z9181 History of falling: Secondary | ICD-10-CM | POA: Diagnosis not present

## 2018-05-27 DIAGNOSIS — I5020 Unspecified systolic (congestive) heart failure: Secondary | ICD-10-CM | POA: Diagnosis not present

## 2018-05-27 DIAGNOSIS — Z7951 Long term (current) use of inhaled steroids: Secondary | ICD-10-CM | POA: Diagnosis not present

## 2018-05-27 DIAGNOSIS — I495 Sick sinus syndrome: Secondary | ICD-10-CM | POA: Diagnosis not present

## 2018-05-27 DIAGNOSIS — G629 Polyneuropathy, unspecified: Secondary | ICD-10-CM | POA: Diagnosis not present

## 2018-05-27 DIAGNOSIS — I69331 Monoplegia of upper limb following cerebral infarction affecting right dominant side: Secondary | ICD-10-CM | POA: Diagnosis not present

## 2018-05-27 DIAGNOSIS — M810 Age-related osteoporosis without current pathological fracture: Secondary | ICD-10-CM | POA: Diagnosis not present

## 2018-05-28 DIAGNOSIS — G4761 Periodic limb movement disorder: Secondary | ICD-10-CM | POA: Diagnosis not present

## 2018-05-28 DIAGNOSIS — H9190 Unspecified hearing loss, unspecified ear: Secondary | ICD-10-CM | POA: Diagnosis not present

## 2018-05-28 DIAGNOSIS — G4700 Insomnia, unspecified: Secondary | ICD-10-CM | POA: Diagnosis not present

## 2018-05-28 DIAGNOSIS — Z87891 Personal history of nicotine dependence: Secondary | ICD-10-CM | POA: Diagnosis not present

## 2018-05-28 DIAGNOSIS — Z7952 Long term (current) use of systemic steroids: Secondary | ICD-10-CM | POA: Diagnosis not present

## 2018-05-28 DIAGNOSIS — I495 Sick sinus syndrome: Secondary | ICD-10-CM | POA: Diagnosis not present

## 2018-05-28 DIAGNOSIS — Z950 Presence of cardiac pacemaker: Secondary | ICD-10-CM | POA: Diagnosis not present

## 2018-05-28 DIAGNOSIS — I700 Atherosclerosis of aorta: Secondary | ICD-10-CM | POA: Diagnosis not present

## 2018-05-28 DIAGNOSIS — K635 Polyp of colon: Secondary | ICD-10-CM | POA: Diagnosis not present

## 2018-05-28 DIAGNOSIS — J439 Emphysema, unspecified: Secondary | ICD-10-CM | POA: Diagnosis not present

## 2018-05-28 DIAGNOSIS — Z7951 Long term (current) use of inhaled steroids: Secondary | ICD-10-CM | POA: Diagnosis not present

## 2018-05-28 DIAGNOSIS — E7800 Pure hypercholesterolemia, unspecified: Secondary | ICD-10-CM | POA: Diagnosis not present

## 2018-05-28 DIAGNOSIS — I69331 Monoplegia of upper limb following cerebral infarction affecting right dominant side: Secondary | ICD-10-CM | POA: Diagnosis not present

## 2018-05-28 DIAGNOSIS — M810 Age-related osteoporosis without current pathological fracture: Secondary | ICD-10-CM | POA: Diagnosis not present

## 2018-05-28 DIAGNOSIS — M170 Bilateral primary osteoarthritis of knee: Secondary | ICD-10-CM | POA: Diagnosis not present

## 2018-05-28 DIAGNOSIS — I5020 Unspecified systolic (congestive) heart failure: Secondary | ICD-10-CM | POA: Diagnosis not present

## 2018-05-28 DIAGNOSIS — Z9181 History of falling: Secondary | ICD-10-CM | POA: Diagnosis not present

## 2018-05-28 DIAGNOSIS — I130 Hypertensive heart and chronic kidney disease with heart failure and stage 1 through stage 4 chronic kidney disease, or unspecified chronic kidney disease: Secondary | ICD-10-CM | POA: Diagnosis not present

## 2018-05-28 DIAGNOSIS — G629 Polyneuropathy, unspecified: Secondary | ICD-10-CM | POA: Diagnosis not present

## 2018-05-28 DIAGNOSIS — Z7982 Long term (current) use of aspirin: Secondary | ICD-10-CM | POA: Diagnosis not present

## 2018-05-28 DIAGNOSIS — N189 Chronic kidney disease, unspecified: Secondary | ICD-10-CM | POA: Diagnosis not present

## 2018-06-03 DIAGNOSIS — J439 Emphysema, unspecified: Secondary | ICD-10-CM | POA: Diagnosis not present

## 2018-06-03 DIAGNOSIS — I700 Atherosclerosis of aorta: Secondary | ICD-10-CM | POA: Diagnosis not present

## 2018-06-03 DIAGNOSIS — I495 Sick sinus syndrome: Secondary | ICD-10-CM | POA: Diagnosis not present

## 2018-06-03 DIAGNOSIS — I5020 Unspecified systolic (congestive) heart failure: Secondary | ICD-10-CM | POA: Diagnosis not present

## 2018-06-03 DIAGNOSIS — G4761 Periodic limb movement disorder: Secondary | ICD-10-CM | POA: Diagnosis not present

## 2018-06-03 DIAGNOSIS — I130 Hypertensive heart and chronic kidney disease with heart failure and stage 1 through stage 4 chronic kidney disease, or unspecified chronic kidney disease: Secondary | ICD-10-CM | POA: Diagnosis not present

## 2018-06-03 DIAGNOSIS — K635 Polyp of colon: Secondary | ICD-10-CM | POA: Diagnosis not present

## 2018-06-03 DIAGNOSIS — Z9181 History of falling: Secondary | ICD-10-CM | POA: Diagnosis not present

## 2018-06-03 DIAGNOSIS — M170 Bilateral primary osteoarthritis of knee: Secondary | ICD-10-CM | POA: Diagnosis not present

## 2018-06-03 DIAGNOSIS — N189 Chronic kidney disease, unspecified: Secondary | ICD-10-CM | POA: Diagnosis not present

## 2018-06-03 DIAGNOSIS — Z87891 Personal history of nicotine dependence: Secondary | ICD-10-CM | POA: Diagnosis not present

## 2018-06-03 DIAGNOSIS — G4700 Insomnia, unspecified: Secondary | ICD-10-CM | POA: Diagnosis not present

## 2018-06-03 DIAGNOSIS — H9190 Unspecified hearing loss, unspecified ear: Secondary | ICD-10-CM | POA: Diagnosis not present

## 2018-06-03 DIAGNOSIS — M810 Age-related osteoporosis without current pathological fracture: Secondary | ICD-10-CM | POA: Diagnosis not present

## 2018-06-03 DIAGNOSIS — E7800 Pure hypercholesterolemia, unspecified: Secondary | ICD-10-CM | POA: Diagnosis not present

## 2018-06-03 DIAGNOSIS — Z950 Presence of cardiac pacemaker: Secondary | ICD-10-CM | POA: Diagnosis not present

## 2018-06-03 DIAGNOSIS — G629 Polyneuropathy, unspecified: Secondary | ICD-10-CM | POA: Diagnosis not present

## 2018-06-03 DIAGNOSIS — Z7952 Long term (current) use of systemic steroids: Secondary | ICD-10-CM | POA: Diagnosis not present

## 2018-06-03 DIAGNOSIS — I69331 Monoplegia of upper limb following cerebral infarction affecting right dominant side: Secondary | ICD-10-CM | POA: Diagnosis not present

## 2018-06-03 DIAGNOSIS — Z7982 Long term (current) use of aspirin: Secondary | ICD-10-CM | POA: Diagnosis not present

## 2018-06-03 DIAGNOSIS — Z7951 Long term (current) use of inhaled steroids: Secondary | ICD-10-CM | POA: Diagnosis not present

## 2018-06-07 DIAGNOSIS — N189 Chronic kidney disease, unspecified: Secondary | ICD-10-CM | POA: Diagnosis not present

## 2018-06-07 DIAGNOSIS — Z7952 Long term (current) use of systemic steroids: Secondary | ICD-10-CM | POA: Diagnosis not present

## 2018-06-07 DIAGNOSIS — I5020 Unspecified systolic (congestive) heart failure: Secondary | ICD-10-CM | POA: Diagnosis not present

## 2018-06-07 DIAGNOSIS — M810 Age-related osteoporosis without current pathological fracture: Secondary | ICD-10-CM | POA: Diagnosis not present

## 2018-06-07 DIAGNOSIS — G4761 Periodic limb movement disorder: Secondary | ICD-10-CM | POA: Diagnosis not present

## 2018-06-07 DIAGNOSIS — E7800 Pure hypercholesterolemia, unspecified: Secondary | ICD-10-CM | POA: Diagnosis not present

## 2018-06-07 DIAGNOSIS — Z7982 Long term (current) use of aspirin: Secondary | ICD-10-CM | POA: Diagnosis not present

## 2018-06-07 DIAGNOSIS — G629 Polyneuropathy, unspecified: Secondary | ICD-10-CM | POA: Diagnosis not present

## 2018-06-07 DIAGNOSIS — Z950 Presence of cardiac pacemaker: Secondary | ICD-10-CM | POA: Diagnosis not present

## 2018-06-07 DIAGNOSIS — G4700 Insomnia, unspecified: Secondary | ICD-10-CM | POA: Diagnosis not present

## 2018-06-07 DIAGNOSIS — I700 Atherosclerosis of aorta: Secondary | ICD-10-CM | POA: Diagnosis not present

## 2018-06-07 DIAGNOSIS — I130 Hypertensive heart and chronic kidney disease with heart failure and stage 1 through stage 4 chronic kidney disease, or unspecified chronic kidney disease: Secondary | ICD-10-CM | POA: Diagnosis not present

## 2018-06-07 DIAGNOSIS — I69331 Monoplegia of upper limb following cerebral infarction affecting right dominant side: Secondary | ICD-10-CM | POA: Diagnosis not present

## 2018-06-07 DIAGNOSIS — Z9181 History of falling: Secondary | ICD-10-CM | POA: Diagnosis not present

## 2018-06-07 DIAGNOSIS — J439 Emphysema, unspecified: Secondary | ICD-10-CM | POA: Diagnosis not present

## 2018-06-07 DIAGNOSIS — Z87891 Personal history of nicotine dependence: Secondary | ICD-10-CM | POA: Diagnosis not present

## 2018-06-07 DIAGNOSIS — I495 Sick sinus syndrome: Secondary | ICD-10-CM | POA: Diagnosis not present

## 2018-06-07 DIAGNOSIS — Z7951 Long term (current) use of inhaled steroids: Secondary | ICD-10-CM | POA: Diagnosis not present

## 2018-06-07 DIAGNOSIS — H9190 Unspecified hearing loss, unspecified ear: Secondary | ICD-10-CM | POA: Diagnosis not present

## 2018-06-07 DIAGNOSIS — K635 Polyp of colon: Secondary | ICD-10-CM | POA: Diagnosis not present

## 2018-06-07 DIAGNOSIS — M170 Bilateral primary osteoarthritis of knee: Secondary | ICD-10-CM | POA: Diagnosis not present

## 2018-06-08 DIAGNOSIS — M1712 Unilateral primary osteoarthritis, left knee: Secondary | ICD-10-CM | POA: Diagnosis not present

## 2018-06-08 DIAGNOSIS — M1711 Unilateral primary osteoarthritis, right knee: Secondary | ICD-10-CM | POA: Diagnosis not present

## 2018-06-10 DIAGNOSIS — M170 Bilateral primary osteoarthritis of knee: Secondary | ICD-10-CM | POA: Diagnosis not present

## 2018-06-10 DIAGNOSIS — J439 Emphysema, unspecified: Secondary | ICD-10-CM | POA: Diagnosis not present

## 2018-06-10 DIAGNOSIS — I69331 Monoplegia of upper limb following cerebral infarction affecting right dominant side: Secondary | ICD-10-CM | POA: Diagnosis not present

## 2018-06-10 DIAGNOSIS — N189 Chronic kidney disease, unspecified: Secondary | ICD-10-CM | POA: Diagnosis not present

## 2018-06-10 DIAGNOSIS — G629 Polyneuropathy, unspecified: Secondary | ICD-10-CM | POA: Diagnosis not present

## 2018-06-10 DIAGNOSIS — Z7982 Long term (current) use of aspirin: Secondary | ICD-10-CM | POA: Diagnosis not present

## 2018-06-10 DIAGNOSIS — Z950 Presence of cardiac pacemaker: Secondary | ICD-10-CM | POA: Diagnosis not present

## 2018-06-10 DIAGNOSIS — Z87891 Personal history of nicotine dependence: Secondary | ICD-10-CM | POA: Diagnosis not present

## 2018-06-10 DIAGNOSIS — E7800 Pure hypercholesterolemia, unspecified: Secondary | ICD-10-CM | POA: Diagnosis not present

## 2018-06-10 DIAGNOSIS — Z7951 Long term (current) use of inhaled steroids: Secondary | ICD-10-CM | POA: Diagnosis not present

## 2018-06-10 DIAGNOSIS — Z7952 Long term (current) use of systemic steroids: Secondary | ICD-10-CM | POA: Diagnosis not present

## 2018-06-10 DIAGNOSIS — I130 Hypertensive heart and chronic kidney disease with heart failure and stage 1 through stage 4 chronic kidney disease, or unspecified chronic kidney disease: Secondary | ICD-10-CM | POA: Diagnosis not present

## 2018-06-10 DIAGNOSIS — M810 Age-related osteoporosis without current pathological fracture: Secondary | ICD-10-CM | POA: Diagnosis not present

## 2018-06-10 DIAGNOSIS — K635 Polyp of colon: Secondary | ICD-10-CM | POA: Diagnosis not present

## 2018-06-10 DIAGNOSIS — G4700 Insomnia, unspecified: Secondary | ICD-10-CM | POA: Diagnosis not present

## 2018-06-10 DIAGNOSIS — Z9181 History of falling: Secondary | ICD-10-CM | POA: Diagnosis not present

## 2018-06-10 DIAGNOSIS — I495 Sick sinus syndrome: Secondary | ICD-10-CM | POA: Diagnosis not present

## 2018-06-10 DIAGNOSIS — I700 Atherosclerosis of aorta: Secondary | ICD-10-CM | POA: Diagnosis not present

## 2018-06-10 DIAGNOSIS — I5020 Unspecified systolic (congestive) heart failure: Secondary | ICD-10-CM | POA: Diagnosis not present

## 2018-06-10 DIAGNOSIS — G4761 Periodic limb movement disorder: Secondary | ICD-10-CM | POA: Diagnosis not present

## 2018-06-10 DIAGNOSIS — H9190 Unspecified hearing loss, unspecified ear: Secondary | ICD-10-CM | POA: Diagnosis not present

## 2018-06-30 DIAGNOSIS — T148XXA Other injury of unspecified body region, initial encounter: Secondary | ICD-10-CM | POA: Diagnosis not present

## 2018-08-04 DIAGNOSIS — M79605 Pain in left leg: Secondary | ICD-10-CM | POA: Diagnosis not present

## 2018-08-04 DIAGNOSIS — M79604 Pain in right leg: Secondary | ICD-10-CM | POA: Diagnosis not present

## 2018-08-04 DIAGNOSIS — Z0000 Encounter for general adult medical examination without abnormal findings: Secondary | ICD-10-CM | POA: Diagnosis not present

## 2018-08-04 DIAGNOSIS — R600 Localized edema: Secondary | ICD-10-CM | POA: Diagnosis not present

## 2018-08-16 DIAGNOSIS — R7989 Other specified abnormal findings of blood chemistry: Secondary | ICD-10-CM | POA: Diagnosis not present

## 2018-08-16 DIAGNOSIS — R0689 Other abnormalities of breathing: Secondary | ICD-10-CM | POA: Diagnosis not present

## 2018-08-16 DIAGNOSIS — M353 Polymyalgia rheumatica: Secondary | ICD-10-CM | POA: Diagnosis not present

## 2018-08-16 DIAGNOSIS — R600 Localized edema: Secondary | ICD-10-CM | POA: Diagnosis not present

## 2018-08-16 DIAGNOSIS — I509 Heart failure, unspecified: Secondary | ICD-10-CM | POA: Diagnosis not present

## 2018-08-20 DIAGNOSIS — H5203 Hypermetropia, bilateral: Secondary | ICD-10-CM | POA: Diagnosis not present

## 2018-08-20 DIAGNOSIS — H353113 Nonexudative age-related macular degeneration, right eye, advanced atrophic without subfoveal involvement: Secondary | ICD-10-CM | POA: Diagnosis not present

## 2018-08-26 DIAGNOSIS — Z950 Presence of cardiac pacemaker: Secondary | ICD-10-CM | POA: Diagnosis not present

## 2018-08-26 DIAGNOSIS — W19XXXA Unspecified fall, initial encounter: Secondary | ICD-10-CM | POA: Diagnosis not present

## 2018-08-26 DIAGNOSIS — I482 Chronic atrial fibrillation, unspecified: Secondary | ICD-10-CM | POA: Diagnosis not present

## 2018-08-26 DIAGNOSIS — I495 Sick sinus syndrome: Secondary | ICD-10-CM | POA: Diagnosis not present

## 2018-08-26 DIAGNOSIS — I959 Hypotension, unspecified: Secondary | ICD-10-CM | POA: Diagnosis not present

## 2018-08-26 DIAGNOSIS — R531 Weakness: Secondary | ICD-10-CM | POA: Diagnosis not present

## 2018-08-26 DIAGNOSIS — I110 Hypertensive heart disease with heart failure: Secondary | ICD-10-CM | POA: Diagnosis not present

## 2018-08-26 DIAGNOSIS — I509 Heart failure, unspecified: Secondary | ICD-10-CM | POA: Diagnosis not present

## 2018-08-26 DIAGNOSIS — E860 Dehydration: Secondary | ICD-10-CM | POA: Diagnosis not present

## 2018-08-26 DIAGNOSIS — I252 Old myocardial infarction: Secondary | ICD-10-CM | POA: Diagnosis not present

## 2018-08-26 DIAGNOSIS — I700 Atherosclerosis of aorta: Secondary | ICD-10-CM | POA: Diagnosis not present

## 2018-09-06 DIAGNOSIS — Z45010 Encounter for checking and testing of cardiac pacemaker pulse generator [battery]: Secondary | ICD-10-CM | POA: Diagnosis not present

## 2018-09-06 DIAGNOSIS — I428 Other cardiomyopathies: Secondary | ICD-10-CM | POA: Diagnosis not present

## 2018-09-06 DIAGNOSIS — Z45018 Encounter for adjustment and management of other part of cardiac pacemaker: Secondary | ICD-10-CM | POA: Diagnosis not present

## 2018-09-06 DIAGNOSIS — I442 Atrioventricular block, complete: Secondary | ICD-10-CM | POA: Diagnosis not present

## 2018-09-23 DIAGNOSIS — H353132 Nonexudative age-related macular degeneration, bilateral, intermediate dry stage: Secondary | ICD-10-CM | POA: Diagnosis not present

## 2018-10-15 ENCOUNTER — Ambulatory Visit: Payer: Medicare Other | Admitting: Sports Medicine

## 2018-10-15 ENCOUNTER — Other Ambulatory Visit: Payer: Self-pay | Admitting: Sports Medicine

## 2018-10-15 ENCOUNTER — Other Ambulatory Visit: Payer: Self-pay

## 2018-10-15 ENCOUNTER — Ambulatory Visit (INDEPENDENT_AMBULATORY_CARE_PROVIDER_SITE_OTHER): Payer: Medicare Other

## 2018-10-15 ENCOUNTER — Encounter: Payer: Self-pay | Admitting: Sports Medicine

## 2018-10-15 DIAGNOSIS — M79672 Pain in left foot: Secondary | ICD-10-CM

## 2018-10-15 DIAGNOSIS — M79675 Pain in left toe(s): Secondary | ICD-10-CM

## 2018-10-15 DIAGNOSIS — M19072 Primary osteoarthritis, left ankle and foot: Secondary | ICD-10-CM | POA: Diagnosis not present

## 2018-10-15 DIAGNOSIS — B351 Tinea unguium: Secondary | ICD-10-CM | POA: Diagnosis not present

## 2018-10-15 DIAGNOSIS — M79674 Pain in right toe(s): Secondary | ICD-10-CM | POA: Diagnosis not present

## 2018-10-15 DIAGNOSIS — L84 Corns and callosities: Secondary | ICD-10-CM | POA: Diagnosis not present

## 2018-10-15 DIAGNOSIS — I739 Peripheral vascular disease, unspecified: Secondary | ICD-10-CM

## 2018-10-15 DIAGNOSIS — M7752 Other enthesopathy of left foot: Secondary | ICD-10-CM

## 2018-10-15 DIAGNOSIS — M779 Enthesopathy, unspecified: Secondary | ICD-10-CM

## 2018-10-15 NOTE — Progress Notes (Signed)
Subjective: Marvin Wright is a 85 y.o. male patient seen today in office with complaint of mildly painful thickened and elongated toenails and callus; unable to trim. Patient denies history of Diabetes, Neuropathy, or known Vascular disease. Patient also admits that he has pain at ball of left foot 2-5/10 that is worse is the evening and when walking on hard surfaces. Patient has not tried any treatment. Patient has no other pedal complaints at this time.   Review of Systems  All other systems reviewed and are negative.   There are no active problems to display for this patient.   Current Outpatient Medications on File Prior to Visit  Medication Sig Dispense Refill  . donepezil (ARICEPT) 5 MG tablet Take 5 mg by mouth at bedtime.    . furosemide (LASIX) 20 MG tablet Take 20 mg by mouth daily as needed.    . gabapentin (NEURONTIN) 300 MG capsule Take 300 mg by mouth at bedtime.    Marland Kitchen omeprazole (PRILOSEC) 20 MG capsule Take 20 mg by mouth daily.    . quinapril-hydrochlorothiazide (ACCURETIC) 20-12.5 MG tablet Take 1 tablet by mouth 2 (two) times daily.    Marland Kitchen rOPINIRole (REQUIP) 2 MG tablet Take 2 mg by mouth at bedtime.    . simvastatin (ZOCOR) 20 MG tablet Take 20 mg by mouth daily at 6 PM.    . Suvorexant (BELSOMRA) 15 MG TABS Take by mouth at bedtime.    . Wheat Dextrin (BENEFIBER PO) Take by mouth.     No current facility-administered medications on file prior to visit.     Allergies  Allergen Reactions  . Penicillins     Objective: Physical Exam  General: Well developed, nourished, no acute distress, awake, alert and oriented x 3  Vascular: Dorsalis pedis artery 1/4 bilateral, Posterior tibial artery 1/4 bilateral, skin temperature warm to warm proximal to distal bilateral lower extremities, + varicosities, scant pedal hair present bilateral.  Neurological: Gross sensation present via light touch bilateral.   Dermatological: Skin is warm, dry, and supple bilateral, Nails  1-10 are tender, long, thick, and discolored with mild subungal debris, no webspace macerations present bilateral, no open lesions present bilateral, + Hyperkeratotic tissue present sub met 1 L>R bilateral. No signs of infection bilateral.  Musculoskeletal: Mild pain to palpation at 2nd MTPJ on left, Mild hammertoe boney deformities noted bilateral. + Fat atrophy bilateral. Prominent met heads. Muscular strength within normal limits without painon range of motion. No pain with calf compression bilateral.  Xrays as below  Assessment and Plan:  Problem List Items Addressed This Visit    None    Visit Diagnoses    Pain due to onychomycosis of toenails of both feet    -  Primary   Callus of foot       PVD (peripheral vascular disease) ([place])       Arthritis of left foot       Capsulitis       Left foot pain         -Examined patient.  -Discussed treatment options for painful mycotic nails and callus. -Mechanically debrided and reduced mycotic nails with sterile nail nipper and dremel nail file without incident. -Mechanically debrided callus sub met 1 on left using sterile chisel blade without incident -Xrays consistent with lesser MTPJ OA -Patient declined injection at this time on left foot  -Dispensed metatarsal pads to use bilateral in shoes -Patient to return in 3 months for follow up evaluation or sooner if symptoms  worsen.  Landis Martins, DPM

## 2018-10-20 DIAGNOSIS — L570 Actinic keratosis: Secondary | ICD-10-CM | POA: Diagnosis not present

## 2018-10-25 ENCOUNTER — Other Ambulatory Visit: Payer: Self-pay | Admitting: Sports Medicine

## 2018-10-25 DIAGNOSIS — I509 Heart failure, unspecified: Secondary | ICD-10-CM | POA: Diagnosis not present

## 2018-10-25 DIAGNOSIS — R0602 Shortness of breath: Secondary | ICD-10-CM | POA: Diagnosis not present

## 2018-10-25 DIAGNOSIS — R7989 Other specified abnormal findings of blood chemistry: Secondary | ICD-10-CM | POA: Diagnosis not present

## 2018-10-25 DIAGNOSIS — M79672 Pain in left foot: Secondary | ICD-10-CM

## 2018-10-25 DIAGNOSIS — M779 Enthesopathy, unspecified: Secondary | ICD-10-CM

## 2018-10-25 DIAGNOSIS — Z23 Encounter for immunization: Secondary | ICD-10-CM | POA: Diagnosis not present

## 2018-11-19 DIAGNOSIS — M1711 Unilateral primary osteoarthritis, right knee: Secondary | ICD-10-CM | POA: Diagnosis not present

## 2018-11-19 DIAGNOSIS — M1712 Unilateral primary osteoarthritis, left knee: Secondary | ICD-10-CM | POA: Diagnosis not present

## 2018-12-13 DIAGNOSIS — Z45018 Encounter for adjustment and management of other part of cardiac pacemaker: Secondary | ICD-10-CM | POA: Diagnosis not present

## 2018-12-13 DIAGNOSIS — Z45010 Encounter for checking and testing of cardiac pacemaker pulse generator [battery]: Secondary | ICD-10-CM | POA: Diagnosis not present

## 2019-01-14 ENCOUNTER — Other Ambulatory Visit: Payer: Self-pay

## 2019-01-14 ENCOUNTER — Ambulatory Visit: Payer: Medicare Other | Admitting: Sports Medicine

## 2019-01-14 ENCOUNTER — Encounter: Payer: Self-pay | Admitting: Sports Medicine

## 2019-01-14 DIAGNOSIS — M79674 Pain in right toe(s): Secondary | ICD-10-CM

## 2019-01-14 DIAGNOSIS — M7742 Metatarsalgia, left foot: Secondary | ICD-10-CM

## 2019-01-14 DIAGNOSIS — B351 Tinea unguium: Secondary | ICD-10-CM

## 2019-01-14 DIAGNOSIS — M7741 Metatarsalgia, right foot: Secondary | ICD-10-CM

## 2019-01-14 DIAGNOSIS — I739 Peripheral vascular disease, unspecified: Secondary | ICD-10-CM

## 2019-01-14 DIAGNOSIS — L84 Corns and callosities: Secondary | ICD-10-CM

## 2019-01-14 DIAGNOSIS — M79675 Pain in left toe(s): Secondary | ICD-10-CM | POA: Diagnosis not present

## 2019-01-14 DIAGNOSIS — M19072 Primary osteoarthritis, left ankle and foot: Secondary | ICD-10-CM

## 2019-01-14 NOTE — Progress Notes (Signed)
Subjective: Marvin Wright is a 85 y.o. male patient seen today in office with complaint of mildly painful thickened and elongated toenails, unable to trim.  Reports that he still gets some achy pain to the balls of both feet. Reports his feet felt better without the pads. Patient denies any changes with medical history since last encounter. Patient has no other pedal complaints at this time.   There are no active problems to display for this patient.   Current Outpatient Medications on File Prior to Visit  Medication Sig Dispense Refill  . albuterol (PROVENTIL) (2.5 MG/3ML) 0.083% nebulizer solution USE 1 VIAL IN NEBULIZER THREE TIMES DAILY    . atorvastatin (LIPITOR) 20 MG tablet Take 20 mg by mouth at bedtime.    . carvedilol (COREG) 12.5 MG tablet Take 12.5 mg by mouth 2 (two) times daily.    . donepezil (ARICEPT) 5 MG tablet Take 5 mg by mouth at bedtime.    . ENTRESTO 24-26 MG Take 1 tablet by mouth 2 (two) times daily.    . furosemide (LASIX) 20 MG tablet Take 20 mg by mouth daily as needed.    . gabapentin (NEURONTIN) 300 MG capsule Take 300 mg by mouth at bedtime.    . levothyroxine (SYNTHROID) 25 MCG tablet Take 25 mcg by mouth daily.    . metolazone (ZAROXOLYN) 2.5 MG tablet Take 2.5 mg by mouth once a week.    . omeprazole (PRILOSEC) 20 MG capsule Take 20 mg by mouth daily.    . Potassium Chloride ER 20 MEQ TBCR TAKE 1 TABLET BY MOUTH TWICE DAILY FOR 14 DAYS    . predniSONE (DELTASONE) 2.5 MG tablet     . quinapril-hydrochlorothiazide (ACCURETIC) 20-12.5 MG tablet Take 1 tablet by mouth 2 (two) times daily.    . rOPINIRole (REQUIP) 2 MG tablet Take 2 mg by mouth at bedtime.    . simvastatin (ZOCOR) 20 MG tablet Take 20 mg by mouth daily at 6 PM.    . Suvorexant (BELSOMRA) 15 MG TABS Take by mouth at bedtime.    . traZODone (DESYREL) 50 MG tablet Take 50 mg by mouth at bedtime.    . Wheat Dextrin (BENEFIBER PO) Take by mouth.     No current facility-administered medications on  file prior to visit.     Allergies  Allergen Reactions  . Penicillins     Objective: Physical Exam  General: Well developed, nourished, no acute distress, awake, alert and oriented x 3  Vascular: Dorsalis pedis artery 1/4 bilateral, Posterior tibial artery 0/4 bilateral, skin temperature warm to cool proximal to distal bilateral lower extremities, + varicosities.   Neurological: Gross sensation present via light touch bilateral.   Dermatological: Skin is warm, dry, and supple bilateral, Nails 1-10 are tender, long, thick, and discolored with mild subungal debris, no webspace macerations present bilateral, no open lesions present bilateral. No signs of infection bilateral.  Musculoskeletal: + hammertoe boney deformities noted bilateral. + Fat atrophy bilateral. Prominent met heads bilateral.  Subjective aching pain to balls of both feet.   Assessment and Plan:  Problem List Items Addressed This Visit    None    Visit Diagnoses    Pain due to onychomycosis of toenails of both feet    -  Primary   Callus of foot       PVD (peripheral vascular disease) ([facility])       Metatarsalgia of both feet       Arthritis of left foot         -  Examined patient.  -Discussed treatment options for painful mycotic nails and foot pain -Mechanically debrided and reduced mycotic nails with sterile nail nipper and dremel nail file without incident. -May d/c met pads -Advised OTC topical pain cream and soaking as needed for pain  -Patient to return in 3 months for follow up evaluation or sooner if symptoms worsen.  Landis Martins, DPM

## 2019-03-14 DIAGNOSIS — I480 Paroxysmal atrial fibrillation: Secondary | ICD-10-CM | POA: Diagnosis not present

## 2019-03-14 DIAGNOSIS — I442 Atrioventricular block, complete: Secondary | ICD-10-CM | POA: Diagnosis not present

## 2019-03-14 DIAGNOSIS — I2510 Atherosclerotic heart disease of native coronary artery without angina pectoris: Secondary | ICD-10-CM | POA: Diagnosis not present

## 2019-03-14 DIAGNOSIS — I428 Other cardiomyopathies: Secondary | ICD-10-CM | POA: Diagnosis not present

## 2019-03-25 DIAGNOSIS — Z01818 Encounter for other preprocedural examination: Secondary | ICD-10-CM | POA: Diagnosis not present

## 2019-03-31 DIAGNOSIS — I442 Atrioventricular block, complete: Secondary | ICD-10-CM | POA: Diagnosis not present

## 2019-03-31 DIAGNOSIS — I110 Hypertensive heart disease with heart failure: Secondary | ICD-10-CM | POA: Diagnosis not present

## 2019-03-31 DIAGNOSIS — I480 Paroxysmal atrial fibrillation: Secondary | ICD-10-CM | POA: Diagnosis not present

## 2019-03-31 DIAGNOSIS — I428 Other cardiomyopathies: Secondary | ICD-10-CM | POA: Diagnosis not present

## 2019-03-31 DIAGNOSIS — I2510 Atherosclerotic heart disease of native coronary artery without angina pectoris: Secondary | ICD-10-CM | POA: Diagnosis not present

## 2019-03-31 DIAGNOSIS — I4892 Unspecified atrial flutter: Secondary | ICD-10-CM | POA: Diagnosis not present

## 2019-04-07 DIAGNOSIS — D1801 Hemangioma of skin and subcutaneous tissue: Secondary | ICD-10-CM | POA: Diagnosis not present

## 2019-04-07 DIAGNOSIS — L570 Actinic keratosis: Secondary | ICD-10-CM | POA: Diagnosis not present

## 2019-04-07 DIAGNOSIS — L821 Other seborrheic keratosis: Secondary | ICD-10-CM | POA: Diagnosis not present

## 2019-04-20 ENCOUNTER — Ambulatory Visit: Payer: Medicare Other | Admitting: Sports Medicine

## 2019-04-25 DIAGNOSIS — E7800 Pure hypercholesterolemia, unspecified: Secondary | ICD-10-CM | POA: Diagnosis not present

## 2019-04-25 DIAGNOSIS — Z79899 Other long term (current) drug therapy: Secondary | ICD-10-CM | POA: Diagnosis not present

## 2019-04-25 DIAGNOSIS — Z0000 Encounter for general adult medical examination without abnormal findings: Secondary | ICD-10-CM | POA: Diagnosis not present

## 2019-04-27 ENCOUNTER — Ambulatory Visit: Payer: Medicare Other | Admitting: Sports Medicine

## 2019-04-27 ENCOUNTER — Encounter: Payer: Self-pay | Admitting: Sports Medicine

## 2019-04-27 ENCOUNTER — Other Ambulatory Visit: Payer: Self-pay

## 2019-04-27 DIAGNOSIS — M7742 Metatarsalgia, left foot: Secondary | ICD-10-CM

## 2019-04-27 DIAGNOSIS — M79674 Pain in right toe(s): Secondary | ICD-10-CM | POA: Diagnosis not present

## 2019-04-27 DIAGNOSIS — M79675 Pain in left toe(s): Secondary | ICD-10-CM | POA: Diagnosis not present

## 2019-04-27 DIAGNOSIS — B351 Tinea unguium: Secondary | ICD-10-CM | POA: Diagnosis not present

## 2019-04-27 DIAGNOSIS — Z1211 Encounter for screening for malignant neoplasm of colon: Secondary | ICD-10-CM | POA: Diagnosis not present

## 2019-04-27 DIAGNOSIS — M7741 Metatarsalgia, right foot: Secondary | ICD-10-CM

## 2019-04-27 DIAGNOSIS — I739 Peripheral vascular disease, unspecified: Secondary | ICD-10-CM

## 2019-04-27 MED ORDER — TRIAMCINOLONE ACETONIDE 10 MG/ML IJ SUSP
10.0000 mg | Freq: Once | INTRAMUSCULAR | Status: AC
  Administered 2019-04-27: 20:00:00 10 mg

## 2019-04-27 NOTE — Progress Notes (Signed)
Subjective: Marvin Wright is a 86 y.o. male patient seen today in office with complaint of mildly painful thickened and elongated toenails, unable to trim.  Reports that he still gets some achy pain to the balls of both feet that has not gotten any better. Patient denies any changes with medical history since last encounter. Patient has no other pedal complaints at this time.   There are no problems to display for this patient.   Current Outpatient Medications on File Prior to Visit  Medication Sig Dispense Refill  . albuterol (PROVENTIL) (2.5 MG/3ML) 0.083% nebulizer solution USE 1 VIAL IN NEBULIZER THREE TIMES DAILY    . atorvastatin (LIPITOR) 20 MG tablet Take 20 mg by mouth at bedtime.    . carvedilol (COREG) 12.5 MG tablet Take 12.5 mg by mouth 2 (two) times daily.    Marland Kitchen donepezil (ARICEPT) 5 MG tablet Take 5 mg by mouth at bedtime.    . furosemide (LASIX) 20 MG tablet Take 20 mg by mouth daily as needed.    . gabapentin (NEURONTIN) 300 MG capsule Take 300 mg by mouth at bedtime.    Marland Kitchen lisinopril (ZESTRIL) 10 MG tablet Take 10 mg by mouth daily.    . metolazone (ZAROXOLYN) 2.5 MG tablet Take 2.5 mg by mouth once a week.    Marland Kitchen omeprazole (PRILOSEC) 20 MG capsule Take 20 mg by mouth daily.    Marland Kitchen PACERONE 200 MG tablet Take 200 mg by mouth daily.    . predniSONE (DELTASONE) 2.5 MG tablet     . quinapril-hydrochlorothiazide (ACCURETIC) 20-12.5 MG tablet Take 1 tablet by mouth 2 (two) times daily.    Marland Kitchen rOPINIRole (REQUIP) 2 MG tablet Take 2 mg by mouth at bedtime.    . traZODone (DESYREL) 50 MG tablet Take 50 mg by mouth at bedtime.     No current facility-administered medications on file prior to visit.    Allergies  Allergen Reactions  . Penicillins     Objective: Physical Exam  General: Well developed, nourished, no acute distress, awake, alert and oriented x 3  Vascular: Dorsalis pedis artery 1/4 bilateral, Posterior tibial artery 0/4 bilateral, skin temperature warm to cool  proximal to distal bilateral lower extremities, + varicosities.   Neurological: Gross sensation present via light touch bilateral.   Dermatological: Skin is warm, dry, and supple bilateral, Nails 1-10 are tender, long, thick, and discolored with mild subungal debris, no webspace macerations present bilateral, no open lesions present bilateral. No signs of infection bilateral.  Musculoskeletal: + hammertoe boney deformities noted bilateral. + Fat atrophy bilateral. Prominent met heads bilateral.  Subjective aching pain to balls of both feet with pain to palpation worse sub met 2 bilateral.   Assessment and Plan:  Problem List Items Addressed This Visit    None    Visit Diagnoses    Pain due to onychomycosis of toenails of both feet    -  Primary   Metatarsalgia of both feet       PVD (peripheral vascular disease) ([facility])       Relevant Medications   PACERONE 200 MG tablet   lisinopril (ZESTRIL) 10 MG tablet     -Examined patient.  -Discussed treatment options for painful mycotic nails and foot pain -Mechanically debrided and reduced mycotic nails with sterile nail nipper and dremel nail file without incident. -After oral consent and aseptic prep, injected a mixture containing 1 ml of 2%  plain lidocaine, 1 ml 0.5% plain marcaine, 0.5 ml of kenalog  10 and 0.5 ml of dexamethasone phosphate into balls of both feet sub met 2 bilateral without complication. Post-injection care discussed with patient.  -Recommend icing tonight for 15 mins -Advised OTC topical pain cream and soaking as needed for pain  -Patient to return in 3 months for follow up evaluation or sooner if symptoms worsen.  Landis Martins, DPM

## 2019-05-03 ENCOUNTER — Other Ambulatory Visit: Payer: Self-pay

## 2019-05-03 NOTE — Patient Outreach (Signed)
[facility] Care Management  05/03/2019  [place] 1923/02/16 [id number]   Medication Adherence call to Mr. Chou Beldin patient phone number is disconnected. Mr. Obannon is showing past due on Lisinopril 10 mg under [place].   [place] Management Direct Dial [phone]  Fax [phone] [email or web address]

## 2019-05-30 DIAGNOSIS — M1711 Unilateral primary osteoarthritis, right knee: Secondary | ICD-10-CM | POA: Diagnosis not present

## 2019-05-30 DIAGNOSIS — M1712 Unilateral primary osteoarthritis, left knee: Secondary | ICD-10-CM | POA: Diagnosis not present

## 2019-06-02 DIAGNOSIS — Z7901 Long term (current) use of anticoagulants: Secondary | ICD-10-CM | POA: Diagnosis not present

## 2019-06-02 DIAGNOSIS — R195 Other fecal abnormalities: Secondary | ICD-10-CM | POA: Diagnosis not present

## 2019-06-02 DIAGNOSIS — D649 Anemia, unspecified: Secondary | ICD-10-CM | POA: Diagnosis not present

## 2019-06-03 DIAGNOSIS — D649 Anemia, unspecified: Secondary | ICD-10-CM | POA: Diagnosis not present

## 2019-06-21 DIAGNOSIS — I442 Atrioventricular block, complete: Secondary | ICD-10-CM | POA: Diagnosis not present

## 2019-06-23 DIAGNOSIS — E860 Dehydration: Secondary | ICD-10-CM | POA: Diagnosis not present

## 2019-06-23 DIAGNOSIS — R531 Weakness: Secondary | ICD-10-CM | POA: Diagnosis not present

## 2019-06-23 DIAGNOSIS — I951 Orthostatic hypotension: Secondary | ICD-10-CM | POA: Diagnosis not present

## 2019-06-24 DIAGNOSIS — K219 Gastro-esophageal reflux disease without esophagitis: Secondary | ICD-10-CM | POA: Diagnosis not present

## 2019-06-24 DIAGNOSIS — I10 Essential (primary) hypertension: Secondary | ICD-10-CM | POA: Diagnosis not present

## 2019-06-29 DIAGNOSIS — K219 Gastro-esophageal reflux disease without esophagitis: Secondary | ICD-10-CM | POA: Diagnosis not present

## 2019-06-29 DIAGNOSIS — R195 Other fecal abnormalities: Secondary | ICD-10-CM | POA: Diagnosis not present

## 2019-06-29 DIAGNOSIS — K635 Polyp of colon: Secondary | ICD-10-CM | POA: Diagnosis not present

## 2019-06-29 DIAGNOSIS — K259 Gastric ulcer, unspecified as acute or chronic, without hemorrhage or perforation: Secondary | ICD-10-CM | POA: Diagnosis not present

## 2019-06-29 DIAGNOSIS — D128 Benign neoplasm of rectum: Secondary | ICD-10-CM | POA: Diagnosis not present

## 2019-06-29 DIAGNOSIS — K2901 Acute gastritis with bleeding: Secondary | ICD-10-CM | POA: Diagnosis not present

## 2019-06-29 DIAGNOSIS — D649 Anemia, unspecified: Secondary | ICD-10-CM | POA: Diagnosis not present

## 2019-06-30 DIAGNOSIS — H6121 Impacted cerumen, right ear: Secondary | ICD-10-CM | POA: Diagnosis not present

## 2019-07-16 DIAGNOSIS — I509 Heart failure, unspecified: Secondary | ICD-10-CM | POA: Diagnosis not present

## 2019-07-16 DIAGNOSIS — Z950 Presence of cardiac pacemaker: Secondary | ICD-10-CM | POA: Diagnosis not present

## 2019-07-16 DIAGNOSIS — I480 Paroxysmal atrial fibrillation: Secondary | ICD-10-CM | POA: Diagnosis not present

## 2019-07-16 DIAGNOSIS — I110 Hypertensive heart disease with heart failure: Secondary | ICD-10-CM | POA: Diagnosis not present

## 2019-07-16 DIAGNOSIS — M7989 Other specified soft tissue disorders: Secondary | ICD-10-CM | POA: Diagnosis not present

## 2019-07-20 DIAGNOSIS — R600 Localized edema: Secondary | ICD-10-CM | POA: Diagnosis not present

## 2019-07-20 DIAGNOSIS — D649 Anemia, unspecified: Secondary | ICD-10-CM | POA: Diagnosis not present

## 2019-07-28 ENCOUNTER — Ambulatory Visit: Payer: Medicare Other | Admitting: Sports Medicine

## 2019-07-28 ENCOUNTER — Encounter: Payer: Self-pay | Admitting: Sports Medicine

## 2019-07-28 ENCOUNTER — Other Ambulatory Visit: Payer: Self-pay

## 2019-07-28 DIAGNOSIS — I739 Peripheral vascular disease, unspecified: Secondary | ICD-10-CM

## 2019-07-28 DIAGNOSIS — M79675 Pain in left toe(s): Secondary | ICD-10-CM | POA: Diagnosis not present

## 2019-07-28 DIAGNOSIS — B351 Tinea unguium: Secondary | ICD-10-CM

## 2019-07-28 DIAGNOSIS — M79674 Pain in right toe(s): Secondary | ICD-10-CM | POA: Diagnosis not present

## 2019-07-28 DIAGNOSIS — M19072 Primary osteoarthritis, left ankle and foot: Secondary | ICD-10-CM

## 2019-07-28 NOTE — Progress Notes (Signed)
Subjective: Marvin Wright is a 86 y.o. male patient seen today in office with complaint of mildly painful thickened and elongated toenails, unable to trim. Patient denies any changes with medical history since last encounter except having an incident where he scraped his leg while riding on his scooter and got caught in a sticky briar bush. Patient has no other pedal complaints at this time.   There are no problems to display for this patient.   Current Outpatient Medications on File Prior to Visit  Medication Sig Dispense Refill  . albuterol (PROVENTIL) (2.5 MG/3ML) 0.083% nebulizer solution USE 1 VIAL IN NEBULIZER THREE TIMES DAILY    . atorvastatin (LIPITOR) 20 MG tablet Take 20 mg by mouth at bedtime.    . carvedilol (COREG) 12.5 MG tablet Take 12.5 mg by mouth 2 (two) times daily.    Marland Kitchen donepezil (ARICEPT) 5 MG tablet Take 5 mg by mouth at bedtime.    . furosemide (LASIX) 20 MG tablet Take 20 mg by mouth daily as needed.    . gabapentin (NEURONTIN) 300 MG capsule Take 300 mg by mouth at bedtime.    Marland Kitchen lisinopril (ZESTRIL) 10 MG tablet Take 10 mg by mouth daily.    . metolazone (ZAROXOLYN) 2.5 MG tablet Take 2.5 mg by mouth once a week.    Marland Kitchen omeprazole (PRILOSEC) 20 MG capsule Take 20 mg by mouth daily.    Marland Kitchen PACERONE 200 MG tablet Take 200 mg by mouth daily.    . predniSONE (DELTASONE) 2.5 MG tablet     . quinapril-hydrochlorothiazide (ACCURETIC) 20-12.5 MG tablet Take 1 tablet by mouth 2 (two) times daily.    Marland Kitchen rOPINIRole (REQUIP) 2 MG tablet Take 2 mg by mouth at bedtime.    . traZODone (DESYREL) 50 MG tablet Take 50 mg by mouth at bedtime.     No current facility-administered medications on file prior to visit.    Allergies  Allergen Reactions  . Penicillins     Objective: Physical Exam  General: Well developed, nourished, no acute distress, awake, alert and oriented x 3  Vascular: Dorsalis pedis artery 1/4 bilateral, Posterior tibial artery 0/4 bilateral, skin temperature  warm to cool proximal to distal bilateral lower extremities, + varicosities.   Neurological: Gross sensation present via light touch bilateral.   Dermatological: Skin is warm, dry, and supple bilateral, Nails 1-10 are tender, long, thick, and discolored with mild subungal debris, no webspace macerations present bilateral, multiple small abrasions on the left lower leg with no redness warmth or acute signs of infection.  Musculoskeletal: + hammertoe boney deformities noted bilateral. + Fat atrophy bilateral. Prominent met heads bilateral.  Assessment and Plan:  Problem List Items Addressed This Visit    None    Visit Diagnoses    Pain due to onychomycosis of toenails of both feet    -  Primary   PVD (peripheral vascular disease) ([place])       Arthritis of left foot          -Examined patient.  -Discussed treatment options for painful mycotic nails and foot pain -Mechanically debrided and reduced mycotic nails with sterile nail nipper and dremel nail file without incident. -Continue with good supportive shoes daily for foot type -Patient to return in 3 months for follow up evaluation or sooner if symptoms worsen.  Landis Martins, DPM

## 2019-08-31 DIAGNOSIS — I2510 Atherosclerotic heart disease of native coronary artery without angina pectoris: Secondary | ICD-10-CM | POA: Diagnosis not present

## 2019-08-31 DIAGNOSIS — Z79899 Other long term (current) drug therapy: Secondary | ICD-10-CM | POA: Diagnosis not present

## 2019-08-31 DIAGNOSIS — D638 Anemia in other chronic diseases classified elsewhere: Secondary | ICD-10-CM | POA: Diagnosis not present

## 2019-08-31 DIAGNOSIS — N183 Chronic kidney disease, stage 3 unspecified: Secondary | ICD-10-CM | POA: Diagnosis not present

## 2019-08-31 DIAGNOSIS — Z7901 Long term (current) use of anticoagulants: Secondary | ICD-10-CM | POA: Diagnosis not present

## 2019-08-31 DIAGNOSIS — I480 Paroxysmal atrial fibrillation: Secondary | ICD-10-CM | POA: Diagnosis not present

## 2019-09-05 ENCOUNTER — Emergency Department (HOSPITAL_COMMUNITY)
Admission: EM | Admit: 2019-09-05 | Discharge: 2019-09-05 | Disposition: A | Discharge status: Home / Self Care | Payer: Medicare Other | Attending: Emergency Medicine | Admitting: Emergency Medicine

## 2019-09-05 ENCOUNTER — Encounter (HOSPITAL_COMMUNITY): Payer: Self-pay

## 2019-09-05 ENCOUNTER — Emergency Department (HOSPITAL_COMMUNITY): Payer: Medicare Other

## 2019-09-05 ENCOUNTER — Other Ambulatory Visit: Payer: Self-pay

## 2019-09-05 DIAGNOSIS — R531 Weakness: Secondary | ICD-10-CM | POA: Insufficient documentation

## 2019-09-05 DIAGNOSIS — I10 Essential (primary) hypertension: Secondary | ICD-10-CM | POA: Insufficient documentation

## 2019-09-05 DIAGNOSIS — F0390 Unspecified dementia without behavioral disturbance: Secondary | ICD-10-CM | POA: Insufficient documentation

## 2019-09-05 DIAGNOSIS — J90 Pleural effusion, not elsewhere classified: Secondary | ICD-10-CM | POA: Diagnosis not present

## 2019-09-05 DIAGNOSIS — Z87891 Personal history of nicotine dependence: Secondary | ICD-10-CM | POA: Insufficient documentation

## 2019-09-05 DIAGNOSIS — Z79899 Other long term (current) drug therapy: Secondary | ICD-10-CM | POA: Diagnosis not present

## 2019-09-05 DIAGNOSIS — N289 Disorder of kidney and ureter, unspecified: Secondary | ICD-10-CM | POA: Insufficient documentation

## 2019-09-05 DIAGNOSIS — R609 Edema, unspecified: Secondary | ICD-10-CM | POA: Insufficient documentation

## 2019-09-05 DIAGNOSIS — R600 Localized edema: Secondary | ICD-10-CM | POA: Diagnosis not present

## 2019-09-05 DIAGNOSIS — M7989 Other specified soft tissue disorders: Secondary | ICD-10-CM | POA: Diagnosis not present

## 2019-09-05 LAB — CBC
HCT: 28 % — ABNORMAL LOW (ref 39.0–52.0)
Hemoglobin: 8.6 g/dL — ABNORMAL LOW (ref 13.0–17.0)
MCH: 28.5 pg (ref 26.0–34.0)
MCHC: 30.7 g/dL (ref 30.0–36.0)
MCV: 92.7 fL (ref 80.0–100.0)
Platelets: 211 10*3/uL (ref 150–400)
RBC: 3.02 MIL/uL — ABNORMAL LOW (ref 4.22–5.81)
RDW: 16.8 % — ABNORMAL HIGH (ref 11.5–15.5)
WBC: 12.9 10*3/uL — ABNORMAL HIGH (ref 4.0–10.5)
nRBC: 0 % (ref 0.0–0.2)

## 2019-09-05 LAB — BASIC METABOLIC PANEL
Anion gap: 11 (ref 5–15)
BUN: 49 mg/dL — ABNORMAL HIGH (ref 8–23)
CO2: 24 mmol/L (ref 22–32)
Calcium: 8.7 mg/dL — ABNORMAL LOW (ref 8.9–10.3)
Chloride: 101 mmol/L (ref 98–111)
Creatinine, Ser: 2.09 mg/dL — ABNORMAL HIGH (ref 0.61–1.24)
GFR calc Af Amer: 30 mL/min — ABNORMAL LOW (ref >60)
GFR calc non Af Amer: 26 mL/min — ABNORMAL LOW (ref >60)
Glucose, Bld: 127 mg/dL — ABNORMAL HIGH (ref 70–99)
Potassium: 4.8 mmol/L (ref 3.5–5.1)
Sodium: 136 mmol/L (ref 135–145)

## 2019-09-05 LAB — CBG MONITORING, ED: Glucose-Capillary: 130 mg/dL — ABNORMAL HIGH (ref 70–99)

## 2019-09-05 LAB — URINALYSIS, ROUTINE W REFLEX MICROSCOPIC
Bilirubin Urine: NEGATIVE
Glucose, UA: NEGATIVE mg/dL
Hgb urine dipstick: NEGATIVE
Ketones, ur: NEGATIVE mg/dL
Leukocytes,Ua: NEGATIVE
Nitrite: NEGATIVE
Protein, ur: NEGATIVE mg/dL
Specific Gravity, Urine: <1.005 — ABNORMAL LOW (ref 1.005–1.030)
pH: 7 (ref 5.0–8.0)

## 2019-09-05 LAB — BRAIN NATRIURETIC PEPTIDE: B Natriuretic Peptide: 419.4 pg/mL — ABNORMAL HIGH (ref 0.0–100.0)

## 2019-09-05 MED ORDER — FUROSEMIDE 10 MG/ML IJ SOLN
40.0000 mg | Freq: Once | INTRAMUSCULAR | Status: AC
  Administered 2019-09-05: 40 mg via INTRAVENOUS
  Filled 2019-09-05: qty 4

## 2019-09-05 NOTE — ED Notes (Addendum)
Pt ambulated in room. Pt ambulated fine, and according to family at bedside, pt ambulated better than usual. Pt was one assisted while ambulating.

## 2019-09-05 NOTE — ED Notes (Signed)
Joe Misner(Son#[phone]) call for an update on his status. Would like a call back.  Thank you

## 2019-09-05 NOTE — ED Triage Notes (Signed)
Pt reports generalized weakness and bilateral leg swelling. States he got up in the night to go to the bathroom and fell asleep on the toilet. Pt alert, nad noted, denies any chest pain or SOB

## 2019-09-05 NOTE — ED Provider Notes (Signed)
[facility] DEPART[facility] Provider Note   CSN: [id number] Arrival date & time: 09/05/19  1036     History Chief Complaint  Patient presents with  . Weakness  . Leg Swelling    Marvin Wright is a 86 y.o. male.  HPI    Patient presents after an episode of falling asleep on the toilet, now with concern for generalized weakness and bilateral leg swelling. Patient denies pain.  He notes that he has had swelling for some time, takes his medication including Lasix regularly.  This morning, about 15 hours ago to the bathroom.  He was found 10 hours ago by home health still on the commode.  He required substantial assistance to get up.  He notes that this not the first time this is occurred.  Currently he denies pain, as above, complains of weakness in his lower extremities, notes that he is undergoing evaluation with physicians,  including referral to urology. Patient is a noted history of dementia, but seems to answer questions about his current presentation, today's events, appropriately.  Past Medical History:  Diagnosis Date  . Hearing loss    Left ear  . Hypercholesterolemia   . Hypertension   . Osteoarthritis   . Periodic limb movement disorder   . Peripheral neuropathy   . Prostate CA ([place])     There are no problems to display for this patient.   Past Surgical History:  Procedure Laterality Date  . APPENDECTOMY    . PENILE PROSTHESIS IMPLANT    . PROSTATECTOMY    . TONSILLECTOMY AND ADENOIDECTOMY         Family History  Problem Relation Age of Onset  . CVA Mother   . Alzheimer's disease Mother   . Lung cancer Father   . Liver cancer Father     Social History   Tobacco Use  . Smoking status: Former Smoker    Quit date: 02/25/1988    Years since quitting: 31.5  . Smokeless tobacco: Never Used  Substance Use Topics  . Alcohol use: No  . Drug use: No    Home Medications Prior to Admission medications   Medication Sig Start  Date End Date Taking? Authorizing Provider  albuterol (PROVENTIL) (2.5 MG/3ML) 0.083% nebulizer solution USE 1 VIAL IN NEBULIZER THREE TIMES DAILY 09/23/18   [provider]  atorvastatin (LIPITOR) 20 MG tablet Take 20 mg by mouth at bedtime. 10/02/18   [provider]  carvedilol (COREG) 12.5 MG tablet Take 12.5 mg by mouth 2 (two) times daily. 09/23/18   [provider]  donepezil (ARICEPT) 5 MG tablet Take 5 mg by mouth at bedtime.    [provider]  furosemide (LASIX) 20 MG tablet Take 20 mg by mouth daily as needed.    [provider]  gabapentin (NEURONTIN) 300 MG capsule Take 300 mg by mouth at bedtime.    [provider]  lisinopril (ZESTRIL) 10 MG tablet Take 10 mg by mouth daily. 03/16/19   [provider]  metolazone (ZAROXOLYN) 2.5 MG tablet Take 2.5 mg by mouth once a week. 08/04/18   [provider]  omeprazole (PRILOSEC) 20 MG capsule Take 20 mg by mouth daily.    [provider]  PACERONE 200 MG tablet Take 200 mg by mouth daily. 04/06/19   [provider]  predniSONE (DELTASONE) 2.5 MG tablet  08/12/18   [provider]  quinapril-hydrochlorothiazide (ACCURETIC) 20-12.5 MG tablet Take 1 tablet by mouth 2 (two)  times daily.    [provider]  rOPINIRole (REQUIP) 2 MG tablet Take 2 mg by mouth at bedtime.    [provider]  traZODone (DESYREL) 50 MG tablet Take 50 mg by mouth at bedtime. 08/02/18   [provider]    Allergies    Penicillins  Review of Systems   Review of Systems  Constitutional:       Per HPI, otherwise negative  HENT:       Per HPI, otherwise negative  Respiratory:       Per HPI, otherwise negative  Cardiovascular:       Per HPI, otherwise negative  Gastrointestinal: Negative for vomiting.  Endocrine:       Negative aside from HPI  Genitourinary:       Neg aside from HPI   Musculoskeletal:       Per HPI, otherwise negative  Skin:  Negative.   Allergic/Immunologic: Negative for immunocompromised state.  Neurological: Positive for weakness. Negative for syncope.    Physical Exam Updated Vital Signs BP (!) 158/48   Pulse 60   Temp 97.7 F (36.5 C) (Oral)   Resp 13   Ht 5\' 11"  (1.803 m)   Wt 88.6 kg   SpO2 100%   BMI 27.24 kg/m   Physical Exam Vitals and nursing note reviewed.  Constitutional:      General: He is not in acute distress.    Appearance: He is well-developed. He is ill-appearing. He is not toxic-appearing.  HENT:     Head: Normocephalic and atraumatic.  Eyes:     Conjunctiva/sclera: Conjunctivae normal.  Cardiovascular:     Rate and Rhythm: Normal rate and regular rhythm.  Pulmonary:     Effort: Pulmonary effort is normal. No respiratory distress.     Breath sounds: No stridor. No wheezing.  Abdominal:     General: There is no distension.  Musculoskeletal:     Right lower leg: Edema present.     Left lower leg: Edema present.  Skin:    General: Skin is warm and dry.  Neurological:     Mental Status: He is alert and oriented to person, place, and time.     Comments: Hearing aids in place otherwise cranial nerves unremarkable, patient is awake, alert, with clear speech, no facial asymmetry, atrophy that is age-appropriate.  Psychiatric:        Mood and Affect: Mood normal.     ED Results / Procedures / Treatments   Labs (all labs ordered are listed, but only abnormal results are displayed) Labs Reviewed  BASIC METABOLIC PANEL - Abnormal; Notable for the following components:      Result Value   Glucose, Bld 127 (*)    BUN 49 (*)    Creatinine, Ser 2.09 (*)    Calcium 8.7 (*)    GFR calc non Af Amer 26 (*)    GFR calc Af Amer 30 (*)    All other components within normal limits  CBC - Abnormal; Notable for the following components:   WBC 12.9 (*)    RBC 3.02 (*)    Hemoglobin 8.6 (*)    HCT 28.0 (*)    RDW 16.8 (*)    All other components within normal limits  URINALYSIS,  ROUTINE W REFLEX MICROSCOPIC - Abnormal; Notable for the following components:   Specific Gravity, Urine <1.005 (*)    All other components within normal limits  BRAIN NATRIURETIC PEPTIDE - Abnormal; Notable for the following components:  B Natriuretic Peptide 419.4 (*)    All other components within normal limits  CBG MONITORING, ED - Abnormal; Notable for the following components:   Glucose-Capillary 130 (*)    All other components within normal limits    EKG EKG Interpretation  Date/Time:  Monday September 05 2019 10:49:44 EDT Ventricular Rate:  61 PR Interval:    QRS Duration: 138 QT Interval:  482 QTC Calculation: 485 R Axis:   -115 Text Interpretation: Ventricular-paced rhythm Abnormal ECG Confirmed by Carmin Muskrat [phone]) on 09/05/2019 5:06:56 PM   Radiology DG Chest Port 1 View  Result Date: 09/05/2019 CLINICAL DATA:  Generalized weakness, leg swelling EXAM: PORTABLE CHEST 1 VIEW COMPARISON:  06/23/2019 FINDINGS: The lungs are well expanded and are symmetric. Minimal left basilar scarring is unchanged. No confluent pulmonary infiltrate. No pneumothorax or pleural effusion. Left subclavian 3 lead pacemaker is unchanged. Cardiac size is within normal limits. Pulmonary vascularity is normal. No acute bone abnormality. IMPRESSION: No active disease. Electronically Signed   By: Fidela Salisbury MD   On: 09/05/2019 18:22    Procedures Procedures (including critical care time)  Medications Ordered in ED Medications  furosemide (LASIX) injection 40 mg (40 mg Intravenous Given 09/05/19 2056)    ED Course  I have reviewed the triage vital signs and the nursing notes.  Pertinent labs & imaging results that were available during my care of the patient were reviewed by me and considered in my medical decision making (see chart for details).   Update:, Patient in no distress.   MDM Rules/Calculators/A&P                          9:15 PM Attempted to contact the patient's son on  the listed telephone number, this was unsuccessful. However, now the patient is accompanied by a male companion who notes that she is a caregiver. We discussed all findings at length, including labs, x-ray.  Most notable findings are no x-ray suggestion of pleural effusion, pulmonary congestion.  However, BNP is elevated, renal function is slightly diminished.  Most recent labs suggest creatinine 1.6 last month, now 2.  With elevated BNP, persistent renal dysfunction, weight a lengthy conversation about the need for close outpatient follow-up, and though the patient will have IV Lasix here, will go home with several days of increased Lasix, I conveyed is much as possible the importance of follow-up again, to ensure renal and cardiac monitoring. Patient was offered admission for IV diuresis as well, but patient replies there is no place like home, states that he would like to do part. Given the absence of hemodynamic instability, oxygen requirement, substantial decline in renal function, this is reasonable.  Final Clinical Impression(s) / ED Diagnoses Final diagnoses:  Peripheral edema  Renal dysfunction     Carmin Muskrat, MD 09/05/19 2118

## 2019-09-05 NOTE — Discharge Instructions (Addendum)
As discussed, poor kidney function and ongoing evidence for congestive heart failure. Efforts to control these 2 are complicated, require close outpatient follow-up.  It is important that you follow-up with both your physician and our nephrology colleagues for appropriate ongoing care.  In the short-term, please use Lasix, 40 mg daily, for the next 4 days, Tuesday, Wednesday, Thursday, Friday.  Return here for concerning changes in your condition.

## 2019-09-08 DIAGNOSIS — R600 Localized edema: Secondary | ICD-10-CM | POA: Diagnosis not present

## 2019-09-20 DIAGNOSIS — I480 Paroxysmal atrial fibrillation: Secondary | ICD-10-CM | POA: Diagnosis not present

## 2019-09-20 DIAGNOSIS — I442 Atrioventricular block, complete: Secondary | ICD-10-CM | POA: Diagnosis not present

## 2019-09-20 DIAGNOSIS — I428 Other cardiomyopathies: Secondary | ICD-10-CM | POA: Diagnosis not present

## 2019-09-20 DIAGNOSIS — I2510 Atherosclerotic heart disease of native coronary artery without angina pectoris: Secondary | ICD-10-CM | POA: Diagnosis not present

## 2019-09-26 DIAGNOSIS — E785 Hyperlipidemia, unspecified: Secondary | ICD-10-CM | POA: Diagnosis not present

## 2019-09-26 DIAGNOSIS — N1832 Chronic kidney disease, stage 3b: Secondary | ICD-10-CM | POA: Diagnosis not present

## 2019-09-26 DIAGNOSIS — I480 Paroxysmal atrial fibrillation: Secondary | ICD-10-CM | POA: Diagnosis not present

## 2019-09-26 DIAGNOSIS — I129 Hypertensive chronic kidney disease with stage 1 through stage 4 chronic kidney disease, or unspecified chronic kidney disease: Secondary | ICD-10-CM | POA: Diagnosis not present

## 2019-09-26 DIAGNOSIS — N189 Chronic kidney disease, unspecified: Secondary | ICD-10-CM | POA: Diagnosis not present

## 2019-09-26 DIAGNOSIS — D631 Anemia in chronic kidney disease: Secondary | ICD-10-CM | POA: Diagnosis not present

## 2019-09-29 DIAGNOSIS — D638 Anemia in other chronic diseases classified elsewhere: Secondary | ICD-10-CM | POA: Diagnosis not present

## 2019-09-29 DIAGNOSIS — M353 Polymyalgia rheumatica: Secondary | ICD-10-CM | POA: Diagnosis not present

## 2019-10-03 ENCOUNTER — Other Ambulatory Visit: Payer: Self-pay | Admitting: Internal Medicine

## 2019-10-03 DIAGNOSIS — N1832 Chronic kidney disease, stage 3b: Secondary | ICD-10-CM

## 2019-10-05 ENCOUNTER — Ambulatory Visit
Admission: RE | Admit: 2019-10-05 | Discharge: 2019-10-05 | Disposition: A | Discharge status: Home / Self Care | Payer: Medicare Other | Source: Ambulatory Visit | Attending: Internal Medicine | Admitting: Internal Medicine

## 2019-10-05 DIAGNOSIS — N1832 Chronic kidney disease, stage 3b: Secondary | ICD-10-CM

## 2019-10-05 DIAGNOSIS — N281 Cyst of kidney, acquired: Secondary | ICD-10-CM | POA: Diagnosis not present

## 2019-10-05 DIAGNOSIS — N183 Chronic kidney disease, stage 3 unspecified: Secondary | ICD-10-CM | POA: Diagnosis not present

## 2019-10-14 DIAGNOSIS — M1712 Unilateral primary osteoarthritis, left knee: Secondary | ICD-10-CM | POA: Diagnosis not present

## 2019-10-14 DIAGNOSIS — M1711 Unilateral primary osteoarthritis, right knee: Secondary | ICD-10-CM | POA: Diagnosis not present

## 2019-10-17 DIAGNOSIS — J40 Bronchitis, not specified as acute or chronic: Secondary | ICD-10-CM | POA: Diagnosis not present

## 2019-11-10 ENCOUNTER — Ambulatory Visit: Payer: Medicare Other | Admitting: Podiatry

## 2019-11-23 DIAGNOSIS — R918 Other nonspecific abnormal finding of lung field: Secondary | ICD-10-CM | POA: Diagnosis not present

## 2019-11-23 DIAGNOSIS — I340 Nonrheumatic mitral (valve) insufficiency: Secondary | ICD-10-CM | POA: Diagnosis not present

## 2019-11-23 DIAGNOSIS — I252 Old myocardial infarction: Secondary | ICD-10-CM | POA: Diagnosis not present

## 2019-11-23 DIAGNOSIS — E440 Moderate protein-calorie malnutrition: Secondary | ICD-10-CM | POA: Diagnosis not present

## 2019-11-23 DIAGNOSIS — J189 Pneumonia, unspecified organism: Secondary | ICD-10-CM | POA: Diagnosis not present

## 2019-11-23 DIAGNOSIS — N183 Chronic kidney disease, stage 3 unspecified: Secondary | ICD-10-CM | POA: Diagnosis not present

## 2019-11-23 DIAGNOSIS — I5033 Acute on chronic diastolic (congestive) heart failure: Secondary | ICD-10-CM | POA: Diagnosis not present

## 2019-11-23 DIAGNOSIS — I10 Essential (primary) hypertension: Secondary | ICD-10-CM | POA: Diagnosis not present

## 2019-11-23 DIAGNOSIS — A419 Sepsis, unspecified organism: Secondary | ICD-10-CM | POA: Diagnosis not present

## 2019-11-23 DIAGNOSIS — R531 Weakness: Secondary | ICD-10-CM | POA: Diagnosis not present

## 2019-11-23 DIAGNOSIS — Z950 Presence of cardiac pacemaker: Secondary | ICD-10-CM | POA: Diagnosis not present

## 2019-11-23 DIAGNOSIS — R0602 Shortness of breath: Secondary | ICD-10-CM | POA: Diagnosis not present

## 2019-11-23 DIAGNOSIS — E039 Hypothyroidism, unspecified: Secondary | ICD-10-CM | POA: Diagnosis not present

## 2019-11-23 DIAGNOSIS — Z743 Need for continuous supervision: Secondary | ICD-10-CM | POA: Diagnosis not present

## 2019-11-23 DIAGNOSIS — J9600 Acute respiratory failure, unspecified whether with hypoxia or hypercapnia: Secondary | ICD-10-CM | POA: Diagnosis not present

## 2019-11-23 DIAGNOSIS — I361 Nonrheumatic tricuspid (valve) insufficiency: Secondary | ICD-10-CM | POA: Diagnosis not present

## 2019-11-23 DIAGNOSIS — D509 Iron deficiency anemia, unspecified: Secondary | ICD-10-CM | POA: Diagnosis not present

## 2019-11-23 DIAGNOSIS — R279 Unspecified lack of coordination: Secondary | ICD-10-CM | POA: Diagnosis not present

## 2019-11-23 DIAGNOSIS — E785 Hyperlipidemia, unspecified: Secondary | ICD-10-CM | POA: Diagnosis not present

## 2019-11-23 DIAGNOSIS — I4892 Unspecified atrial flutter: Secondary | ICD-10-CM | POA: Diagnosis not present

## 2019-11-23 DIAGNOSIS — J440 Chronic obstructive pulmonary disease with acute lower respiratory infection: Secondary | ICD-10-CM | POA: Diagnosis not present

## 2019-11-23 DIAGNOSIS — J159 Unspecified bacterial pneumonia: Secondary | ICD-10-CM | POA: Diagnosis not present

## 2019-11-23 DIAGNOSIS — I517 Cardiomegaly: Secondary | ICD-10-CM | POA: Diagnosis not present

## 2019-11-23 DIAGNOSIS — E876 Hypokalemia: Secondary | ICD-10-CM | POA: Diagnosis not present

## 2019-11-23 DIAGNOSIS — R0689 Other abnormalities of breathing: Secondary | ICD-10-CM | POA: Diagnosis not present

## 2019-11-23 DIAGNOSIS — I2510 Atherosclerotic heart disease of native coronary artery without angina pectoris: Secondary | ICD-10-CM | POA: Diagnosis not present

## 2019-11-23 DIAGNOSIS — Z881 Allergy status to other antibiotic agents status: Secondary | ICD-10-CM | POA: Diagnosis not present

## 2019-11-23 DIAGNOSIS — I480 Paroxysmal atrial fibrillation: Secondary | ICD-10-CM | POA: Diagnosis not present

## 2019-11-23 DIAGNOSIS — J811 Chronic pulmonary edema: Secondary | ICD-10-CM | POA: Diagnosis not present

## 2019-11-23 DIAGNOSIS — J441 Chronic obstructive pulmonary disease with (acute) exacerbation: Secondary | ICD-10-CM | POA: Diagnosis not present

## 2019-11-23 DIAGNOSIS — I509 Heart failure, unspecified: Secondary | ICD-10-CM | POA: Diagnosis not present

## 2019-11-23 DIAGNOSIS — R846 Abnormal cytological findings in specimens from respiratory organs and thorax: Secondary | ICD-10-CM | POA: Diagnosis not present

## 2019-11-23 DIAGNOSIS — R404 Transient alteration of awareness: Secondary | ICD-10-CM | POA: Diagnosis not present

## 2019-11-23 DIAGNOSIS — R6520 Severe sepsis without septic shock: Secondary | ICD-10-CM | POA: Diagnosis not present

## 2019-11-23 DIAGNOSIS — Z9089 Acquired absence of other organs: Secondary | ICD-10-CM | POA: Diagnosis not present

## 2019-11-23 DIAGNOSIS — I130 Hypertensive heart and chronic kidney disease with heart failure and stage 1 through stage 4 chronic kidney disease, or unspecified chronic kidney disease: Secondary | ICD-10-CM | POA: Diagnosis not present

## 2019-11-23 DIAGNOSIS — J9601 Acute respiratory failure with hypoxia: Secondary | ICD-10-CM | POA: Diagnosis not present

## 2019-11-23 DIAGNOSIS — N1831 Chronic kidney disease, stage 3a: Secondary | ICD-10-CM | POA: Diagnosis not present

## 2019-11-23 DIAGNOSIS — J90 Pleural effusion, not elsewhere classified: Secondary | ICD-10-CM | POA: Diagnosis not present

## 2019-11-23 DIAGNOSIS — K219 Gastro-esophageal reflux disease without esophagitis: Secondary | ICD-10-CM | POA: Diagnosis not present

## 2019-11-23 DIAGNOSIS — R0902 Hypoxemia: Secondary | ICD-10-CM | POA: Diagnosis not present

## 2019-11-24 DIAGNOSIS — I509 Heart failure, unspecified: Secondary | ICD-10-CM | POA: Diagnosis not present

## 2019-11-24 DIAGNOSIS — I5033 Acute on chronic diastolic (congestive) heart failure: Secondary | ICD-10-CM | POA: Diagnosis not present

## 2019-11-24 DIAGNOSIS — A419 Sepsis, unspecified organism: Secondary | ICD-10-CM | POA: Diagnosis not present

## 2019-11-24 DIAGNOSIS — I4892 Unspecified atrial flutter: Secondary | ICD-10-CM | POA: Diagnosis not present

## 2019-11-24 DIAGNOSIS — I10 Essential (primary) hypertension: Secondary | ICD-10-CM

## 2019-11-24 DIAGNOSIS — Z950 Presence of cardiac pacemaker: Secondary | ICD-10-CM

## 2019-11-24 DIAGNOSIS — J189 Pneumonia, unspecified organism: Secondary | ICD-10-CM

## 2019-11-24 DIAGNOSIS — N183 Chronic kidney disease, stage 3 unspecified: Secondary | ICD-10-CM | POA: Diagnosis not present

## 2019-11-25 DIAGNOSIS — R0602 Shortness of breath: Secondary | ICD-10-CM | POA: Diagnosis not present

## 2019-11-25 DIAGNOSIS — I509 Heart failure, unspecified: Secondary | ICD-10-CM | POA: Diagnosis not present

## 2019-11-25 DIAGNOSIS — J189 Pneumonia, unspecified organism: Secondary | ICD-10-CM | POA: Diagnosis not present

## 2019-11-25 DIAGNOSIS — J90 Pleural effusion, not elsewhere classified: Secondary | ICD-10-CM | POA: Diagnosis not present

## 2019-11-25 DIAGNOSIS — N183 Chronic kidney disease, stage 3 unspecified: Secondary | ICD-10-CM | POA: Diagnosis not present

## 2019-11-25 DIAGNOSIS — I517 Cardiomegaly: Secondary | ICD-10-CM | POA: Diagnosis not present

## 2019-11-25 DIAGNOSIS — J811 Chronic pulmonary edema: Secondary | ICD-10-CM | POA: Diagnosis not present

## 2019-11-25 DIAGNOSIS — I4892 Unspecified atrial flutter: Secondary | ICD-10-CM | POA: Diagnosis not present

## 2019-11-25 DIAGNOSIS — I340 Nonrheumatic mitral (valve) insufficiency: Secondary | ICD-10-CM | POA: Diagnosis not present

## 2019-11-25 DIAGNOSIS — I361 Nonrheumatic tricuspid (valve) insufficiency: Secondary | ICD-10-CM | POA: Diagnosis not present

## 2019-11-26 DIAGNOSIS — Z950 Presence of cardiac pacemaker: Secondary | ICD-10-CM | POA: Diagnosis not present

## 2019-11-26 DIAGNOSIS — I509 Heart failure, unspecified: Secondary | ICD-10-CM | POA: Diagnosis not present

## 2019-11-26 DIAGNOSIS — J159 Unspecified bacterial pneumonia: Secondary | ICD-10-CM

## 2019-11-26 DIAGNOSIS — I4892 Unspecified atrial flutter: Secondary | ICD-10-CM | POA: Diagnosis not present

## 2019-11-27 DIAGNOSIS — J90 Pleural effusion, not elsewhere classified: Secondary | ICD-10-CM | POA: Diagnosis not present

## 2019-11-28 DIAGNOSIS — R846 Abnormal cytological findings in specimens from respiratory organs and thorax: Secondary | ICD-10-CM | POA: Diagnosis not present

## 2019-11-28 DIAGNOSIS — R918 Other nonspecific abnormal finding of lung field: Secondary | ICD-10-CM | POA: Diagnosis not present

## 2019-11-28 DIAGNOSIS — J90 Pleural effusion, not elsewhere classified: Secondary | ICD-10-CM | POA: Diagnosis not present

## 2019-11-29 DIAGNOSIS — J90 Pleural effusion, not elsewhere classified: Secondary | ICD-10-CM | POA: Diagnosis not present

## 2019-11-30 DIAGNOSIS — R846 Abnormal cytological findings in specimens from respiratory organs and thorax: Secondary | ICD-10-CM | POA: Diagnosis not present

## 2019-12-26 DEATH — deceased
